# Patient Record
Sex: Male | Born: 1950 | Race: White | Hispanic: No | Smoking: Current every day smoker
Health system: Southern US, Community
[De-identification: ages and names within clinical notes are randomized; demographics above are authoritative.]

## PROBLEM LIST (undated history)

## (undated) DIAGNOSIS — G4730 Sleep apnea, unspecified: Secondary | ICD-10-CM

## (undated) DIAGNOSIS — J449 Chronic obstructive pulmonary disease, unspecified: Secondary | ICD-10-CM

---

## 2015-07-18 ENCOUNTER — Other Ambulatory Visit: Payer: Self-pay

## 2015-07-19 ENCOUNTER — Other Ambulatory Visit: Payer: Self-pay

## 2016-07-17 ENCOUNTER — Inpatient Hospital Stay (HOSPITAL_COMMUNITY): Payer: Medicare HMO

## 2016-07-17 ENCOUNTER — Emergency Department (HOSPITAL_COMMUNITY): Payer: Medicare HMO

## 2016-07-17 ENCOUNTER — Inpatient Hospital Stay (HOSPITAL_COMMUNITY)
Admission: EM | Admit: 2016-07-17 | Discharge: 2016-07-20 | DRG: 208 | Disposition: A | Discharge status: Home / Self Care | Payer: Medicare HMO | Attending: Internal Medicine | Admitting: Internal Medicine

## 2016-07-17 ENCOUNTER — Encounter (HOSPITAL_COMMUNITY): Payer: Self-pay | Admitting: Emergency Medicine

## 2016-07-17 DIAGNOSIS — J9622 Acute and chronic respiratory failure with hypercapnia: Secondary | ICD-10-CM | POA: Diagnosis present

## 2016-07-17 DIAGNOSIS — R739 Hyperglycemia, unspecified: Secondary | ICD-10-CM | POA: Diagnosis present

## 2016-07-17 DIAGNOSIS — T380X5A Adverse effect of glucocorticoids and synthetic analogues, initial encounter: Secondary | ICD-10-CM | POA: Diagnosis not present

## 2016-07-17 DIAGNOSIS — R791 Abnormal coagulation profile: Secondary | ICD-10-CM | POA: Diagnosis present

## 2016-07-17 DIAGNOSIS — R778 Other specified abnormalities of plasma proteins: Secondary | ICD-10-CM | POA: Diagnosis present

## 2016-07-17 DIAGNOSIS — I471 Supraventricular tachycardia: Secondary | ICD-10-CM | POA: Diagnosis present

## 2016-07-17 DIAGNOSIS — G9340 Encephalopathy, unspecified: Secondary | ICD-10-CM

## 2016-07-17 DIAGNOSIS — E872 Acidosis: Secondary | ICD-10-CM | POA: Diagnosis present

## 2016-07-17 DIAGNOSIS — J9601 Acute respiratory failure with hypoxia: Secondary | ICD-10-CM | POA: Diagnosis not present

## 2016-07-17 DIAGNOSIS — R030 Elevated blood-pressure reading, without diagnosis of hypertension: Secondary | ICD-10-CM | POA: Diagnosis present

## 2016-07-17 DIAGNOSIS — J441 Chronic obstructive pulmonary disease with (acute) exacerbation: Secondary | ICD-10-CM | POA: Diagnosis present

## 2016-07-17 DIAGNOSIS — D72829 Elevated white blood cell count, unspecified: Secondary | ICD-10-CM | POA: Diagnosis not present

## 2016-07-17 DIAGNOSIS — I480 Paroxysmal atrial fibrillation: Secondary | ICD-10-CM | POA: Diagnosis not present

## 2016-07-17 DIAGNOSIS — F17210 Nicotine dependence, cigarettes, uncomplicated: Secondary | ICD-10-CM | POA: Diagnosis present

## 2016-07-17 DIAGNOSIS — N179 Acute kidney failure, unspecified: Secondary | ICD-10-CM | POA: Diagnosis present

## 2016-07-17 DIAGNOSIS — G4730 Sleep apnea, unspecified: Secondary | ICD-10-CM | POA: Diagnosis present

## 2016-07-17 DIAGNOSIS — I959 Hypotension, unspecified: Secondary | ICD-10-CM | POA: Diagnosis present

## 2016-07-17 DIAGNOSIS — J9602 Acute respiratory failure with hypercapnia: Secondary | ICD-10-CM

## 2016-07-17 DIAGNOSIS — Z9289 Personal history of other medical treatment: Secondary | ICD-10-CM

## 2016-07-17 DIAGNOSIS — J9621 Acute and chronic respiratory failure with hypoxia: Secondary | ICD-10-CM | POA: Diagnosis present

## 2016-07-17 DIAGNOSIS — R0600 Dyspnea, unspecified: Secondary | ICD-10-CM | POA: Diagnosis not present

## 2016-07-17 DIAGNOSIS — J9600 Acute respiratory failure, unspecified whether with hypoxia or hypercapnia: Secondary | ICD-10-CM

## 2016-07-17 DIAGNOSIS — I2699 Other pulmonary embolism without acute cor pulmonale: Secondary | ICD-10-CM

## 2016-07-17 DIAGNOSIS — J449 Chronic obstructive pulmonary disease, unspecified: Secondary | ICD-10-CM | POA: Diagnosis not present

## 2016-07-17 HISTORY — DX: Chronic obstructive pulmonary disease, unspecified: J44.9

## 2016-07-17 HISTORY — DX: Sleep apnea, unspecified: G47.30

## 2016-07-17 LAB — RAPID URINE DRUG SCREEN, HOSP PERFORMED
Amphetamines: NOT DETECTED
Barbiturates: NOT DETECTED
Benzodiazepines: NOT DETECTED
Cocaine: NOT DETECTED
OPIATES: NOT DETECTED
Tetrahydrocannabinol: NOT DETECTED

## 2016-07-17 LAB — CBC WITH DIFFERENTIAL/PLATELET
BASOS PCT: 1 %
Basophils Absolute: 0.2 10*3/uL — ABNORMAL HIGH (ref 0.0–0.1)
EOS ABS: 0.8 10*3/uL — AB (ref 0.0–0.7)
EOS PCT: 5 %
HCT: 43.9 % (ref 39.0–52.0)
HEMOGLOBIN: 13.9 g/dL (ref 13.0–17.0)
LYMPHS PCT: 42 %
Lymphs Abs: 6.8 10*3/uL — ABNORMAL HIGH (ref 0.7–4.0)
MCH: 30.5 pg (ref 26.0–34.0)
MCHC: 31.7 g/dL (ref 30.0–36.0)
MCV: 96.5 fL (ref 78.0–100.0)
Monocytes Absolute: 1.3 10*3/uL — ABNORMAL HIGH (ref 0.1–1.0)
Monocytes Relative: 8 %
NEUTROS ABS: 7.1 10*3/uL (ref 1.7–7.7)
Neutrophils Relative %: 44 %
Platelets: 410 10*3/uL — ABNORMAL HIGH (ref 150–400)
RBC: 4.55 MIL/uL (ref 4.22–5.81)
RDW: 17.4 % — ABNORMAL HIGH (ref 11.5–15.5)
WBC: 16.2 10*3/uL — ABNORMAL HIGH (ref 4.0–10.5)

## 2016-07-17 LAB — POCT I-STAT 3, ART BLOOD GAS (G3+)
Acid-base deficit: 4 mmol/L — ABNORMAL HIGH (ref 0.0–2.0)
Bicarbonate: 23.9 mmol/L (ref 20.0–28.0)
O2 Saturation: 89 %
PCO2 ART: 52.4 mmHg — AB (ref 32.0–48.0)
PO2 ART: 65 mmHg — AB (ref 83.0–108.0)
TCO2: 25 mmol/L (ref 0–100)
pH, Arterial: 7.266 — ABNORMAL LOW (ref 7.350–7.450)

## 2016-07-17 LAB — COMPREHENSIVE METABOLIC PANEL
ALK PHOS: 52 U/L (ref 38–126)
ALT: 20 U/L (ref 17–63)
ANION GAP: 12 (ref 5–15)
AST: 26 U/L (ref 15–41)
Albumin: 3.8 g/dL (ref 3.5–5.0)
BILIRUBIN TOTAL: 0.4 mg/dL (ref 0.3–1.2)
BUN: 28 mg/dL — ABNORMAL HIGH (ref 6–20)
CALCIUM: 8.8 mg/dL — AB (ref 8.9–10.3)
CO2: 24 mmol/L (ref 22–32)
CREATININE: 1.42 mg/dL — AB (ref 0.61–1.24)
Chloride: 104 mmol/L (ref 101–111)
GFR, EST AFRICAN AMERICAN: 58 mL/min — AB (ref >60)
GFR, EST NON AFRICAN AMERICAN: 50 mL/min — AB (ref >60)
Glucose, Bld: 314 mg/dL — ABNORMAL HIGH (ref 65–99)
Potassium: 4.4 mmol/L (ref 3.5–5.1)
SODIUM: 140 mmol/L (ref 135–145)
TOTAL PROTEIN: 6.3 g/dL — AB (ref 6.5–8.1)

## 2016-07-17 LAB — BASIC METABOLIC PANEL
ANION GAP: 13 (ref 5–15)
BUN: 27 mg/dL — ABNORMAL HIGH (ref 6–20)
CHLORIDE: 103 mmol/L (ref 101–111)
CO2: 19 mmol/L — AB (ref 22–32)
Calcium: 7.3 mg/dL — ABNORMAL LOW (ref 8.9–10.3)
Creatinine, Ser: 1.41 mg/dL — ABNORMAL HIGH (ref 0.61–1.24)
GFR calc Af Amer: 58 mL/min — ABNORMAL LOW (ref >60)
GFR, EST NON AFRICAN AMERICAN: 50 mL/min — AB (ref >60)
GLUCOSE: 413 mg/dL — AB (ref 65–99)
POTASSIUM: 3.1 mmol/L — AB (ref 3.5–5.1)
Sodium: 135 mmol/L (ref 135–145)

## 2016-07-17 LAB — CBC
HCT: 35.4 % — ABNORMAL LOW (ref 39.0–52.0)
Hemoglobin: 11.1 g/dL — ABNORMAL LOW (ref 13.0–17.0)
MCH: 29.8 pg (ref 26.0–34.0)
MCHC: 30.8 g/dL (ref 30.0–36.0)
MCV: 96.7 fL (ref 78.0–100.0)
Platelets: 307 10*3/uL (ref 150–400)
RBC: 3.66 MIL/uL — ABNORMAL LOW (ref 4.22–5.81)
RDW: 17.4 % — AB (ref 11.5–15.5)
WBC: 10.7 10*3/uL — AB (ref 4.0–10.5)

## 2016-07-17 LAB — GLUCOSE, CAPILLARY
GLUCOSE-CAPILLARY: 249 mg/dL — AB (ref 65–99)
GLUCOSE-CAPILLARY: 149 mg/dL — AB (ref 65–99)
GLUCOSE-CAPILLARY: 174 mg/dL — AB (ref 65–99)
GLUCOSE-CAPILLARY: 170 mg/dL — AB (ref 65–99)
Glucose-Capillary: 241 mg/dL — ABNORMAL HIGH (ref 65–99)
Glucose-Capillary: 154 mg/dL — ABNORMAL HIGH (ref 65–99)

## 2016-07-17 LAB — I-STAT ARTERIAL BLOOD GAS, ED
ACID-BASE DEFICIT: 3 mmol/L — AB (ref 0.0–2.0)
Bicarbonate: 29 mmol/L — ABNORMAL HIGH (ref 20.0–28.0)
O2 SAT: 100 %
TCO2: 32 mmol/L (ref 0–100)
pCO2 arterial: 88.5 mmHg (ref 32.0–48.0)
pH, Arterial: 7.121 — CL (ref 7.350–7.450)
pO2, Arterial: 336 mmHg — ABNORMAL HIGH (ref 83.0–108.0)

## 2016-07-17 LAB — RESPIRATORY PANEL BY PCR
Adenovirus: NOT DETECTED
BORDETELLA PERTUSSIS-RVPCR: NOT DETECTED
CHLAMYDOPHILA PNEUMONIAE-RVPPCR: NOT DETECTED
CORONAVIRUS HKU1-RVPPCR: NOT DETECTED
Coronavirus 229E: NOT DETECTED
Coronavirus NL63: NOT DETECTED
Coronavirus OC43: NOT DETECTED
INFLUENZA A-RVPPCR: NOT DETECTED
INFLUENZA B-RVPPCR: NOT DETECTED
METAPNEUMOVIRUS-RVPPCR: NOT DETECTED
Mycoplasma pneumoniae: NOT DETECTED
PARAINFLUENZA VIRUS 2-RVPPCR: NOT DETECTED
PARAINFLUENZA VIRUS 3-RVPPCR: NOT DETECTED
PARAINFLUENZA VIRUS 4-RVPPCR: NOT DETECTED
Parainfluenza Virus 1: NOT DETECTED
RESPIRATORY SYNCYTIAL VIRUS-RVPPCR: NOT DETECTED
RHINOVIRUS / ENTEROVIRUS - RVPPCR: NOT DETECTED

## 2016-07-17 LAB — TROPONIN I
TROPONIN I: 0.26 ng/mL — AB (ref <0.03)
Troponin I: 0.43 ng/mL (ref <0.03)
Troponin I: 0.63 ng/mL (ref <0.03)

## 2016-07-17 LAB — PROCALCITONIN: PROCALCITONIN: 0.23 ng/mL

## 2016-07-17 LAB — MRSA PCR SCREENING: MRSA BY PCR: NEGATIVE

## 2016-07-17 LAB — BRAIN NATRIURETIC PEPTIDE: B NATRIURETIC PEPTIDE 5: 49.6 pg/mL (ref 0.0–100.0)

## 2016-07-17 LAB — HEPARIN LEVEL (UNFRACTIONATED): HEPARIN UNFRACTIONATED: 0.62 [IU]/mL (ref 0.30–0.70)

## 2016-07-17 LAB — I-STAT TROPONIN, ED: TROPONIN I, POC: 0 ng/mL (ref 0.00–0.08)

## 2016-07-17 LAB — TRIGLYCERIDES: TRIGLYCERIDES: 113 mg/dL (ref <150)

## 2016-07-17 LAB — LACTIC ACID, PLASMA
Lactic Acid, Venous: 2.2 mmol/L (ref 0.5–1.9)
Lactic Acid, Venous: 3.2 mmol/L (ref 0.5–1.9)

## 2016-07-17 LAB — D-DIMER, QUANTITATIVE (NOT AT ARMC): D DIMER QUANT: 1.78 ug{FEU}/mL — AB (ref 0.00–0.50)

## 2016-07-17 LAB — MAGNESIUM: Magnesium: 2.2 mg/dL (ref 1.7–2.4)

## 2016-07-17 LAB — PHOSPHORUS: Phosphorus: 3.3 mg/dL (ref 2.5–4.6)

## 2016-07-17 LAB — PATHOLOGIST SMEAR REVIEW

## 2016-07-17 MED ORDER — IOPAMIDOL (ISOVUE-370) INJECTION 76%
INTRAVENOUS | Status: AC
  Administered 2016-07-17: 100 mL
  Filled 2016-07-17: qty 100

## 2016-07-17 MED ORDER — POTASSIUM CHLORIDE 2 MEQ/ML IV SOLN
30.0000 meq | Freq: Once | INTRAVENOUS | Status: AC
  Administered 2016-07-17: 30 meq via INTRAVENOUS
  Filled 2016-07-17: qty 15

## 2016-07-17 MED ORDER — HEPARIN (PORCINE) IN NACL 100-0.45 UNIT/ML-% IJ SOLN
1400.0000 [IU]/h | INTRAMUSCULAR | Status: DC
  Administered 2016-07-17: 1400 [IU]/h via INTRAVENOUS
  Filled 2016-07-17 (×2): qty 250

## 2016-07-17 MED ORDER — DEXTROSE 5 % IV SOLN
500.0000 mg | INTRAVENOUS | Status: DC
  Administered 2016-07-17 – 2016-07-18 (×2): 500 mg via INTRAVENOUS
  Filled 2016-07-17 (×2): qty 500

## 2016-07-17 MED ORDER — FENTANYL 2500MCG IN NS 250ML (10MCG/ML) PREMIX INFUSION
25.0000 ug/h | INTRAVENOUS | Status: DC
Start: 2016-07-17 — End: 2016-07-17
  Administered 2016-07-17: 25 ug/h via INTRAVENOUS
  Filled 2016-07-17: qty 250

## 2016-07-17 MED ORDER — PROPOFOL 1000 MG/100ML IV EMUL
0.0000 ug/kg/min | INTRAVENOUS | Status: DC
  Administered 2016-07-17: 20 ug/kg/min via INTRAVENOUS

## 2016-07-17 MED ORDER — ALBUTEROL SULFATE (2.5 MG/3ML) 0.083% IN NEBU
10.0000 mg | INHALATION_SOLUTION | Freq: Once | RESPIRATORY_TRACT | Status: AC
  Administered 2016-07-17: 10 mg via RESPIRATORY_TRACT

## 2016-07-17 MED ORDER — SODIUM CHLORIDE 0.9 % IV BOLUS (SEPSIS)
500.0000 mL | Freq: Once | INTRAVENOUS | Status: AC
  Administered 2016-07-17: 500 mL via INTRAVENOUS

## 2016-07-17 MED ORDER — ALBUTEROL SULFATE (2.5 MG/3ML) 0.083% IN NEBU
2.5000 mg | INHALATION_SOLUTION | RESPIRATORY_TRACT | Status: DC | PRN
  Administered 2016-07-19: 2.5 mg via RESPIRATORY_TRACT
  Filled 2016-07-17: qty 3

## 2016-07-17 MED ORDER — ALBUTEROL SULFATE (2.5 MG/3ML) 0.083% IN NEBU: 2.5000 mg | INHALATION_SOLUTION | RESPIRATORY_TRACT | Status: DC | PRN

## 2016-07-17 MED ORDER — DEXMEDETOMIDINE HCL IN NACL 400 MCG/100ML IV SOLN
0.4000 ug/kg/h | INTRAVENOUS | Status: DC
  Administered 2016-07-17 (×2): 1 ug/kg/h via INTRAVENOUS
  Administered 2016-07-17: 1.2 ug/kg/h via INTRAVENOUS
  Administered 2016-07-17 – 2016-07-18 (×3): 1 ug/kg/h via INTRAVENOUS
  Filled 2016-07-17 (×7): qty 100

## 2016-07-17 MED ORDER — SODIUM CHLORIDE 0.9 % IV SOLN: 250.0000 mL | INTRAVENOUS | Status: DC | PRN

## 2016-07-17 MED ORDER — DEXTROSE 5 % IV SOLN
2.0000 g | Freq: Every day | INTRAVENOUS | Status: AC
  Administered 2016-07-17 – 2016-07-18 (×3): 2 g via INTRAVENOUS
  Filled 2016-07-17 (×3): qty 2

## 2016-07-17 MED ORDER — SODIUM CHLORIDE 0.9 % IV SOLN
0.0000 ug/min | INTRAVENOUS | Status: DC
  Administered 2016-07-17: 20 ug/min via INTRAVENOUS
  Filled 2016-07-17 (×2): qty 1

## 2016-07-17 MED ORDER — DEXMEDETOMIDINE HCL 200 MCG/2ML IV SOLN
0.4000 ug/kg/h | INTRAVENOUS | Status: DC
  Administered 2016-07-17: 0.4 ug/kg/h via INTRAVENOUS
  Administered 2016-07-17: 1 ug/kg/h via INTRAVENOUS
  Filled 2016-07-17: qty 2

## 2016-07-17 MED ORDER — ETOMIDATE 2 MG/ML IV SOLN
INTRAVENOUS | Status: AC | PRN
  Administered 2016-07-17: 20 mg via INTRAVENOUS

## 2016-07-17 MED ORDER — ORAL CARE MOUTH RINSE
15.0000 mL | Freq: Four times a day (QID) | OROMUCOSAL | Status: DC
  Administered 2016-07-17 – 2016-07-18 (×4): 15 mL via OROMUCOSAL

## 2016-07-17 MED ORDER — IPRATROPIUM-ALBUTEROL 0.5-2.5 (3) MG/3ML IN SOLN
3.0000 mL | RESPIRATORY_TRACT | Status: DC
  Administered 2016-07-17 – 2016-07-18 (×7): 3 mL via RESPIRATORY_TRACT
  Filled 2016-07-17 (×7): qty 3

## 2016-07-17 MED ORDER — FENTANYL CITRATE (PF) 100 MCG/2ML IJ SOLN: 50.0000 ug | INTRAMUSCULAR | Status: DC | PRN

## 2016-07-17 MED ORDER — FENTANYL CITRATE (PF) 100 MCG/2ML IJ SOLN
25.0000 ug | INTRAMUSCULAR | Status: DC | PRN
  Administered 2016-07-17: 25 ug via INTRAVENOUS
  Filled 2016-07-17: qty 2

## 2016-07-17 MED ORDER — HEPARIN BOLUS VIA INFUSION
5000.0000 [IU] | Freq: Once | INTRAVENOUS | Status: AC
  Administered 2016-07-17: 5000 [IU] via INTRAVENOUS
  Filled 2016-07-17: qty 5000

## 2016-07-17 MED ORDER — IPRATROPIUM-ALBUTEROL 0.5-2.5 (3) MG/3ML IN SOLN: 3.0000 mL | Freq: Four times a day (QID) | RESPIRATORY_TRACT | Status: DC

## 2016-07-17 MED ORDER — INSULIN ASPART 100 UNIT/ML ~~LOC~~ SOLN
0.0000 [IU] | SUBCUTANEOUS | Status: DC
  Administered 2016-07-17: 2 [IU] via SUBCUTANEOUS
  Administered 2016-07-17 (×2): 3 [IU] via SUBCUTANEOUS
  Administered 2016-07-17: 5 [IU] via SUBCUTANEOUS
  Administered 2016-07-17: 3 [IU] via SUBCUTANEOUS
  Administered 2016-07-17: 5 [IU] via SUBCUTANEOUS
  Administered 2016-07-18: 3 [IU] via SUBCUTANEOUS
  Administered 2016-07-18: 2 [IU] via SUBCUTANEOUS
  Administered 2016-07-18 (×2): 3 [IU] via SUBCUTANEOUS
  Administered 2016-07-19: 2 [IU] via SUBCUTANEOUS

## 2016-07-17 MED ORDER — SODIUM CHLORIDE 0.9 % IV BOLUS (SEPSIS)
1000.0000 mL | Freq: Once | INTRAVENOUS | Status: AC
  Administered 2016-07-17: 1000 mL via INTRAVENOUS

## 2016-07-17 MED ORDER — ALBUTEROL SULFATE (2.5 MG/3ML) 0.083% IN NEBU
INHALATION_SOLUTION | RESPIRATORY_TRACT | Status: AC
  Filled 2016-07-17: qty 12

## 2016-07-17 MED ORDER — FENTANYL CITRATE (PF) 100 MCG/2ML IJ SOLN
50.0000 ug | INTRAMUSCULAR | Status: DC | PRN
  Administered 2016-07-17: 50 ug via INTRAVENOUS
  Filled 2016-07-17: qty 2

## 2016-07-17 MED ORDER — METHYLPREDNISOLONE SODIUM SUCC 40 MG IJ SOLR
40.0000 mg | Freq: Two times a day (BID) | INTRAMUSCULAR | Status: DC
  Filled 2016-07-17 (×2): qty 1

## 2016-07-17 MED ORDER — METHYLPREDNISOLONE SODIUM SUCC 40 MG IJ SOLR
40.0000 mg | Freq: Four times a day (QID) | INTRAMUSCULAR | Status: DC
  Administered 2016-07-17 – 2016-07-19 (×8): 40 mg via INTRAVENOUS
  Filled 2016-07-17 (×10): qty 1

## 2016-07-17 MED ORDER — HEPARIN SODIUM (PORCINE) 5000 UNIT/ML IJ SOLN: 5000.0000 [IU] | Freq: Three times a day (TID) | INTRAMUSCULAR | Status: DC

## 2016-07-17 MED ORDER — PROPOFOL 1000 MG/100ML IV EMUL
5.0000 ug/kg/min | Freq: Once | INTRAVENOUS | Status: AC
  Administered 2016-07-17: 5 ug/kg/min via INTRAVENOUS
  Filled 2016-07-17: qty 100

## 2016-07-17 MED ORDER — SODIUM CHLORIDE 0.9 % IV SOLN
INTRAVENOUS | Status: DC
  Administered 2016-07-17: 04:00:00 via INTRAVENOUS

## 2016-07-17 MED ORDER — MIDAZOLAM HCL 2 MG/2ML IJ SOLN
1.0000 mg | INTRAMUSCULAR | Status: DC | PRN
  Administered 2016-07-17 (×4): 2 mg via INTRAVENOUS
  Administered 2016-07-17: 1 mg via INTRAVENOUS
  Administered 2016-07-17: 2 mg via INTRAVENOUS
  Filled 2016-07-17 (×6): qty 2
  Filled 2016-07-17: qty 4

## 2016-07-17 MED ORDER — PANTOPRAZOLE SODIUM 40 MG IV SOLR
40.0000 mg | Freq: Every day | INTRAVENOUS | Status: DC
  Administered 2016-07-17 – 2016-07-18 (×3): 40 mg via INTRAVENOUS
  Filled 2016-07-17 (×3): qty 40

## 2016-07-17 MED ORDER — CHLORHEXIDINE GLUCONATE 0.12% ORAL RINSE (MEDLINE KIT)
15.0000 mL | Freq: Two times a day (BID) | OROMUCOSAL | Status: DC
  Administered 2016-07-17 – 2016-07-18 (×3): 15 mL via OROMUCOSAL

## 2016-07-17 MED ORDER — HEPARIN SODIUM (PORCINE) 5000 UNIT/ML IJ SOLN
5000.0000 [IU] | Freq: Three times a day (TID) | INTRAMUSCULAR | Status: DC
  Administered 2016-07-17 – 2016-07-20 (×8): 5000 [IU] via SUBCUTANEOUS
  Filled 2016-07-17 (×10): qty 1

## 2016-07-17 MED ORDER — ALBUTEROL (5 MG/ML) CONTINUOUS INHALATION SOLN: 15.0000 mg/h | INHALATION_SOLUTION | Freq: Once | RESPIRATORY_TRACT | Status: DC

## 2016-07-17 MED ORDER — SUCCINYLCHOLINE CHLORIDE 20 MG/ML IJ SOLN
INTRAMUSCULAR | Status: AC | PRN
  Administered 2016-07-17: 150 mg via INTRAVENOUS

## 2016-07-17 NOTE — Progress Notes (Signed)
Patient transferred from ER to 2MW6 no events. 100% O2

## 2016-07-17 NOTE — Progress Notes (Signed)
Pt transported to and from 2M06 to CT 2 on ventilator with no complications. VS within normal limits. RT will continue to monitor.

## 2016-07-17 NOTE — Progress Notes (Signed)
Dr. Allena Earing for Low bp, panic Troponin .26 and low KCL 3.1. Orders received. Patient remains stable, wakeful and request ET tube be removed.

## 2016-07-17 NOTE — Progress Notes (Signed)
SCD hose removed due to patient agitation. Patient also on heparin for DVT prophylaxis.

## 2016-07-17 NOTE — Progress Notes (Signed)
Pt transported to/from CT back to ER Trauma B bay. 100% O2 no complications noted .

## 2016-07-17 NOTE — Progress Notes (Addendum)
ANTICOAGULATION CONSULT NOTE - Follow Up Consult  Pharmacy Consult for Heparin Indication: chest pain/ACS  No Known Allergies  Patient Measurements: Height:  (177.8 cm) Weight: 182 lb 15.7 oz (83 kg) IBW/kg (Calculated) : 73 Heparin Dosing Weight: 88.5  Vital Signs: Temp: 98.9 F (37.2 C) (04/04 1200) Temp Source: Oral (04/04 1200) BP: 107/63 (04/04 1130) Pulse Rate: 94 (04/04 1130)  Labs:  Recent Labs  07/17/16 0043 07/17/16 0546 07/17/16 1123  HGB 13.9 11.1*  --   HCT 43.9 35.4*  --   PLT 410* 307  --   HEPARINUNFRC  --   --  0.62  CREATININE 1.42* 1.41*  --   TROPONINI  --  0.26*  --     Estimated Creatinine Clearance: 52.5 mL/min (A) (by C-G formula based on SCr of 1.41 mg/dL (H)).  Assessment: 66 yo male admitted with COPD exacerbation and respiratory failure, initially with suspicion for PE. No AC PTA. D-dimer elevated. Dopplers and CTA negative for VTE; however, topronins now elevated x2.   Hgb down to 11.1, PLTC down, wnl. No bleeding noted.   Pt originally rec'd bolus of 5000 units and started at 14 units/hr. Heparin level therapeutic at 0.62 on 14 units/hr.   Goal of Therapy:  Heparin level 0.3-0.7 units/ml Monitor platelets by anticoagulation protocol: Yes   Plan:  Continue Heparin 14 units/hr Confirmatory heparin level  Daily HL, CBC Monitor for s/sx bleeding F/u CTA, dopplers   Allena Katz, Pharm.D. PGY1 Pharmacy Resident 4/4/201812:32 PM Pager [phone]

## 2016-07-17 NOTE — Progress Notes (Signed)
210 mls fentanyl gtt wasted in sink. Witnessed by Eddie North, RN.

## 2016-07-17 NOTE — Progress Notes (Addendum)
Family at bedside, updated as to patient progress and plan of care. Wife Burna Mortimer. Family lives out of town in [place].

## 2016-07-17 NOTE — Progress Notes (Signed)
eLink Physician-Brief Progress Note Patient Name: Darrell Shah DOB: 03-28-1951 MRN: [id number]   Date of Service  07/17/2016  HPI/Events of Note  Elevated d-dimer.  eICU Interventions  Will check LE dopplers. Start heparin empirically. Hold off on CTA chest due to renal insuff.     Intervention Category Intermediate Interventions: Diagnostic test evaluation  Dorothyann Gibbs 07/17/2016, 4:52 AM

## 2016-07-17 NOTE — Progress Notes (Signed)
Patient reaching for ETT trying to remove. Precidex Drip increased to max dose at this time for agitation.

## 2016-07-17 NOTE — Progress Notes (Signed)
eLink Physician-Brief Progress Note Patient Name: [name] DOB: 1951-01-13 MRN: [id number]   Date of Service  07/17/2016  HPI/Events of Note  Notified by nursing of BP 67/40  eICU Interventions  Will give fluid bolus. Check LA and repeat ABG.     Intervention Category Major Interventions: Hypotension - evaluation and management  Dorothyann Gibbs 07/17/2016, 3:15 AM

## 2016-07-17 NOTE — Progress Notes (Signed)
**  Preliminary report by tech**  Bilateral lower extremity venous duplex completed. There is no evidence of deep or superficial vein thrombosis involving the right and left lower extremities. All visualized vessels appear patent and compressible. There is no evidence of Baker's cysts bilaterally. Results were given to the patient's nurse, Josh.  07/17/16 1:30 PM Olen Cordial RVT

## 2016-07-17 NOTE — Progress Notes (Signed)
Pharmacy consulted to start subcutaneous heparin for VTE prophylaxis as no evidence of PE/DVT. Verified heparin drip was discontinued by RN. No bleeding noted. Will start prophylactic heparin.  Plan: Discontinue IV heparin drip Start heparin 5000 units subcutaneously q 8 hours Pharmacy will sign off.  Thank you for allowing [place] to participate in this patients care. Signe Colt, PharmD Pager: [phone]

## 2016-07-17 NOTE — Procedures (Signed)
Central Venous Catheter Insertion Procedure Note Darrell Shah [id number] 21-Apr-1950  Procedure: Insertion of Central Venous Catheter Indications: Dialysis need  Procedure Details Consent: Risks of procedure as well as the alternatives and risks of each were explained to the (patient/caregiver).  Consent for procedure obtained. Time Out: Verified patient identification, verified procedure, site/side was marked, verified correct patient position, special equipment/implants available, medications/allergies/relevent history reviewed, required imaging and test results available.  Performed  Maximum sterile technique was used including antiseptics, cap, gloves, gown, hand hygiene, mask and sheet. Skin prep: Chlorhexidine; local anesthetic administered A antimicrobial bonded/coated triple lumen catheter was placed in the right internal jugular vein using the Seldinger technique.  Evaluation Blood flow good Complications: No apparent complications Patient did tolerate procedure well. Chest X-ray ordered to verify placement.  CXR: normal.  U/S used in placement.  Darrell Shah 07/17/2016, 12:43 PM

## 2016-07-17 NOTE — Progress Notes (Signed)
ANTICOAGULATION CONSULT NOTE - Initial Consult  Pharmacy Consult for Heparin Indication: suspected VTE  No Known Allergies  Patient Measurements: Height:  (177.8 cm) Weight: 182 lb 15.7 oz (83 kg) IBW/kg (Calculated) : 73  Vital Signs: Temp: 97.2 F (36.2 C) (04/04 0356) Temp Source: Oral (04/04 0356) BP: 82/53 (04/04 0415) Pulse Rate: 77 (04/04 0415)  Labs:  Recent Labs  07/17/16 0043  HGB 13.9  HCT 43.9  PLT 410*  CREATININE 1.42*    Estimated Creatinine Clearance: 52.1 mL/min (A) (by C-G formula based on SCr of 1.42 mg/dL (H)).   Medical History: Past Medical History:  Diagnosis Date  . COPD (chronic obstructive pulmonary disease) (HCC)     Medications:  Awaiting home med rec  Assessment: 66 y.o. M presents with SOB requiring intubation in the ED. To begin heparin gtt for suspected VTE. CBC ok on admission.  Goal of Therapy:  Heparin level 0.3-0.7 units/ml Monitor platelets by anticoagulation protocol: Yes   Plan:  D/c SQ heparin (none given yet) Heparin IV bolus 5000 units Heparin gtt at 1400 units/hr Will f/u heparin level in 6 hours Daily heparin level and CBC  Christoper Fabian, PharmD, BCPS Clinical pharmacist, pager [phone] 07/17/2016,4:31 AM

## 2016-07-17 NOTE — ED Provider Notes (Signed)
Pt intubated due to respiratory failure likely secondary to COPD exacerbation  INTUBATION Performed by: Thermon Leyland  Required items: required blood products, implants, devices, and special equipment available Patient identity confirmed: provided demographic data and hospital-assigned identification number Time out: Immediately prior to procedure a "time out" was called to verify the correct patient, procedure, equipment, support staff and site/side marked as required.  Indications: Respiratory failure  Intubation method: Glidescope Laryngoscopy   Preoxygenation: BiPAP, Bag mask  Sedatives: Etomidate Paralytic: Succinylcholine  Tube Size: 8 cuffed  Post-procedure assessment: chest rise and ETCO2 monitor Breath sounds: equal and absent over the epigastrium Tube secured with: ETT holder Chest x-ray interpreted by radiologist and me.  Chest x-ray findings: endotracheal tube in appropriate position  Patient tolerated the procedure well with no immediate complications.     Eyvonne Mechanic, PA-C 07/17/16 9528    Shon Baton, MD 07/17/16 [phone]

## 2016-07-17 NOTE — Progress Notes (Signed)
Pt was tried of NIV on arrival to the ED. Didn't tolerated it well and Failed NIV due to increase WOB in the upper 50's, poor CO2 clearance, and marked hypercarbic acidosis. Pt resemble accessory muscle usage and appears very uncomfortable. Patient was subsequently Intubated.   Pt has a history of COPD and currently still a cigarette smoker.

## 2016-07-17 NOTE — Progress Notes (Addendum)
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 Quickly decomp with AMS and resp acidosis and was subsequently intubated. Renal function limits CTA (Cr 1.4), started on empiric heparin for PE  LINES / TUBES: L hand PIV R hand PIV L forearm PIV ETT 4/4>>>  CULTURES: Resp panel>>> Resp culture>>> MRSA screen NEG>>>  ANTIBIOTICS: Ceftriaxone 4/4>>> Azithromycin 4/4>>>  OTHER: Phenylephrine ggt >> Heparin ggt >> Solumedrol 40 Q6H >> Precedex >> Duonebs Q4H >>  SIGNIFICANT EVENTS:  3 am: hypotensive to 68/41, s/p 2 L bolus with minimal improvement 730 am: started on IV neo  INTERVAL HISTORY: No events overnight, arousable.  VITAL SIGNS: Temp:  [95.7 F (35.4 C)-97.7 F (36.5 C)] 97.2 F (36.2 C) (04/04 0356) Pulse Rate:  [46-131] 83 (04/04 0715) Resp:  [14-30] 24 (04/04 0715) BP: (64-141)/(36-92) 79/56 (04/04 0715) SpO2:  [91 %-100 %] 100 % (04/04 0715) FiO2 (%):  [40 %] 40 % (04/04 0400) Weight:  [182 lb 15.7 oz (83 kg)-195 lb (88.5 kg)] 182 lb 15.7 oz (83 kg) (04/04 0320)  HEMODYNAMICS:    VENTILATOR SETTINGS: Vent Mode: PRVC FiO2 (%):  [40 %] 40 % Set Rate:  [24 bmp] 24 bmp Vt Set:  [580 mL] 580 mL PEEP:  [5 cmH20] 5 cmH20 Plateau Pressure:  [25 cmH20-26 cmH20] 26 cmH20  INTAKE / OUTPUT: Intake/Output      04/03 0701 - 04/04 0700 04/04 0701 - 04/05 0700   I.V. (mL/kg) 1159.8 (14)    IV Piggyback 2300    Total Intake(mL/kg) 3459.8 (41.7)    Urine (mL/kg/hr) 350    Total Output 350     Net +3109.8           PHYSICAL EXAMINATION: General:  Critically-ill appearing caucasian male. +ETT, +NG Neuro:  RASS -1, -2 Cardiovascular:  R, IR, no murmur appr Lungs:  Coarse and dim bs  BL Abdomen: Protuberant but soft. +bs Musculoskeletal:  Trace edema BL LE. R>L Skin:  Without cyanosis  LABS: Cbc  Recent Labs Lab 07/17/16 0043 07/17/16 0546  WBC 16.2* 10.7*  HGB 13.9 11.1*  HCT 43.9 35.4*  PLT 410* 307   Chemistry  Recent Labs Lab 07/17/16 0043 07/17/16 0546  NA 140 135  K 4.4 3.1*  CL 104 103  CO2 24 19*  BUN 28* 27*  CREATININE 1.42* 1.41*  CALCIUM 8.8* 7.3*  MG  --  2.2  PHOS  --  3.3  GLUCOSE 314* 413*   Liver fxn  Recent Labs Lab 07/17/16 0043  AST 26  ALT 20  ALKPHOS 52  BILITOT 0.4  PROT 6.3*  ALBUMIN 3.8   coags No results for input(s): APTT, INR in the last 168 hours. Sepsis markers  Recent Labs Lab 07/17/16 0317 07/17/16 0546  LATICACIDVEN 2.2* 3.2*  PROCALCITON  --  0.23   Cardiac markers  Recent Labs Lab 07/17/16 0546  TROPONINI 0.26*   BNP No results for input(s): PROBNP in the last 168 hours. ABG  Recent Labs Lab 07/17/16 0051 07/17/16 0325  PHART 7.121* 7.266*  PCO2ART 88.5* 52.4*  PO2ART 336.0* 65.0*  HCO3 29.0* 23.9  TCO2 32 25   CBG trend  Recent Labs Lab 07/17/16 0257 07/17/16 0355  GLUCAP 249* 241*   IMAGING:  ECG:  DIAGNOSES: Active Problems:   Acute hypoxemic respiratory failure (HCC)  ASSESSMENT / PLAN:  PULMONARY ASSESSMENT: Acute hypoxemic respiratory failure with hypercarbia 2/2 PE ?COPD exacerbation  Initial ABG pH 7.1, CO2 88 --> pH 7.26, pCO2 54, pO2 65 D-dimer 1.78, Cr 1.4. V/Q scan not applicable.  Cr 1.4,  Pct 0.23  PLAN:   ECHO pending BL LE dopplers doppler Doubt COPD exacerbation, cont Abx and steroids for now.  Resp panel pend CTA PE  CARDIOVASCULAR ASSESSMENT: Hypotensive, propofol vs PE. Req pressors Trop 0.26, likely heart strain from PE  PLAN:  Phenylephrine Trend trop, on tele W/u as above If TLC then will need to place TLC  RENAL ASSESSMENT:  Cr 1.4, unknown BL  PLAN:   Has been receiving IF 1L NS  GASTROINTESTINAL ASSESSMENT:   NPO, NG  PLAN:   Cont  HEMATOLOGIC ASSESSMENT:   PLAN:  CBC in AM Transfuse per ICU protocol  INFECTIOUS ASSESSMENT:  ?Copd exac PLAN:   Ceftriaxone + Azith start 4/4  ENDOCRINE ASSESSMENT:  Hyperglycemic, likely DM2   PLAN:   ISS Hba1c  NEUROLOGIC ASSESSMENT:  AMS 2/2 hypercarbia.  UDS neg CT head neg  PLAN:   Precedex PRN fentanyl  CLINICAL SUMMARY: Will perform CTA today.  Continue heparin.  Treat COPD exacerbation.  The patient is critically ill with multiple organ systems failure and requires high complexity decision making for assessment and support, frequent evaluation and titration of therapies, application of advanced monitoring technologies and extensive interpretation of multiple databases.   Critical Care Time devoted to patient care services described in this note is  35  Minutes. This time reflects time of care of this signee Dr Koren Bound. This critical care time does not reflect procedure time, or teaching time or supervisory time of PA/NP/Med student/Med Resident etc but could involve care discussion time.  Alyson Reedy, M.D. [facility] Pulmonary/Critical Care Medicine. Pager: [phone]. After hours pager: [phone].  07/17/2016, 7:31 AM

## 2016-07-17 NOTE — ED Triage Notes (Signed)
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 Pt responsive to voice, given  solumedrol, 15 mg albuterol, 0.3 epi IM PTA, NPA in place, BP-160/100, capnography- 46

## 2016-07-17 NOTE — H&P (Signed)
PULMONARY / CRITICAL CARE MEDICINE   Name: Darrell Shah MRN: [id number] DOB: 12-07-1950    ADMISSION DATE:  07/17/2016 CONSULTATION DATE:  07/17/16  REFERRING MD:  [facility] - ED[facility]  CHIEF COMPLAINT:  SOB  HISTORY OF PRESENT ILLNESS:  Pt is encephelopathic; therefore, this HPI is obtained from chart review. Darrell Shah is a 66 y.o. male with PMH of COPD per report and who works as a Naval architect in [employer].  He pulled over on the side of the road on evening of 07/16/16 for SOB.  After EMS arrived, his SOB became progressively worse.  He was brought to ED where ABG revealed hypercarbic respiratory failure.  He was tried on BiPAP but did not improve and was subsequently intubated.  PCCM was subsequently called for admission.   PAST MEDICAL HISTORY :  He  has a past medical history of COPD (chronic obstructive pulmonary disease) (HCC).  PAST SURGICAL HISTORY: He  has no past surgical history on file.  No Known Allergies  No current facility-administered medications on file prior to encounter.    No current outpatient prescriptions on file prior to encounter.    FAMILY HISTORY:  His has no family status information on file.    SOCIAL HISTORY: He    REVIEW OF SYSTEMS:   Unable to obtain as pt is encephalopathic.  SUBJECTIVE:  On vent, agitated.  VITAL SIGNS: BP 97/80   Pulse (!) 102   Temp 97.7 F (36.5 C) (Axillary)   Resp 17   Ht  (1.778 m)   Wt 88.5 kg (195 lb)   SpO2 99%   BMI 27.98 kg/m   HEMODYNAMICS:    VENTILATOR SETTINGS: Vent Mode: PRVC FiO2 (%):  [40 %] 40 % Set Rate:  [24 bmp] 24 bmp Vt Set:  [580 mL] 580 mL PEEP:  [5 cmH20] 5 cmH20 Plateau Pressure:  [25 cmH20] 25 cmH20  INTAKE / OUTPUT: No intake/output data recorded.   PHYSICAL EXAMINATION: General: Adult male, agitated on vent. Neuro: Agitated on vent. HEENT: [place]/AT. PERRL, sclerae anicteric. Cardiovascular: RRR, no M/R/G.  Lungs: Respirations even and unlabored.  Expiratory wheezes  bilaterally. Abdomen: BS x 4, soft, NT/ND.  Musculoskeletal: No gross deformities, no edema.  Skin: Intact, warm, no rashes.  LABS:  BMET  Recent Labs Lab 07/17/16 0043  NA 140  K 4.4  CL 104  CO2 24  BUN 28*  CREATININE 1.42*  GLUCOSE 314*    Electrolytes  Recent Labs Lab 07/17/16 0043  CALCIUM 8.8*    CBC  Recent Labs Lab 07/17/16 0043  WBC 16.2*  HGB 13.9  HCT 43.9  PLT 410*    Coag's No results for input(s): APTT, INR in the last 168 hours.  Sepsis Markers No results for input(s): LATICACIDVEN, PROCALCITON, O2SATVEN in the last 168 hours.  ABG  Recent Labs Lab 07/17/16 0051  PHART 7.121*  PCO2ART 88.5*  PO2ART 336.0*    Liver Enzymes  Recent Labs Lab 07/17/16 0043  AST 26  ALT 20  ALKPHOS 52  BILITOT 0.4  ALBUMIN 3.8    Cardiac Enzymes No results for input(s): TROPONINI, PROBNP in the last 168 hours.  Glucose No results for input(s): GLUCAP in the last 168 hours.  Imaging No results found.   STUDIES:  CT head 4/4 >   CULTURES: Sputum 4/4 >   ANTIBIOTICS: Ceftriaxone 4/4 >   SIGNIFICANT EVENTS: 4/4 > admit.  LINES/TUBES: ETT 4/4 >   DISCUSSION: 66 y.o. male with hx of COPD  per report, admitted 4/4 with hypercarbic respiratory failure.  Failed BiPAP and was subsequently intubated.  ASSESSMENT / PLAN:  PULMONARY A: Acute hypercarbic respiratory failure. COPD per report - with probable exacerbation. ? 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 P:   Full vent support. Wean as able. VAP prevention measures. SBT in AM if able. Empiric ceftiaxone. DuoNebs / Albuterol. Assess D-Dimer, if elevated then start empiric heparin and assess LE duplex (CTA restricted due to AKI). CXR in AM.  NEUROLOGIC A:   Acute encephalopathy due to sedation + hypercarbia. P:   Sedation:  Propofol gtt / Fentanyl  gtt. RASS goal: 0 to -1. Daily WUA. Assess UDS.  CARDIOVASCULAR A:  Hypotension - presumed due to RSI. P:  Monitor.  RENAL A:   AKI - unclear baseline. P:   NS @ 75. BMP in AM.  GASTROINTESTINAL A:   GI prophylaxis. Nutrition. P:   SUP: Pantoprazole. NPO.  HEMATOLOGIC A:   VTE Prophylaxis. P:  SCD's / heparin. CBC in AM.  INFECTIOUS A:   Possible AECOPD. P:   Abx as above (ceftriaxone).  Follow cultures as above.  ENDOCRINE A:   Hyperglycemia - no known hx DM.   P:   SSI. Assess Hgb A1c.   Family updated: None available.  Interdisciplinary Family Meeting v Palliative Care Meeting:  Due by: 07/23/16.  CC time: 30 min.   Rutherford Guys, [place] - [facility] [place] Pulmonary & Critical Care Medicine Pager: [phone]  or [phone] 07/17/2016, 1:31 AM

## 2016-07-17 NOTE — ED Provider Notes (Signed)
[facility]EMERGENCY DEPT Provider Note   CSN: [id number] Arrival date & time: 07/17/16  0040  By signing my name below, I, Arianna Nassar, attest that this documentation has been prepared under the direction and in the presence of Shon Baton, MD.  Electronically Signed: Octavia Heir, ED Scribe. 07/17/16. 12:50 AM.    History   Chief Complaint Chief Complaint  Patient presents with  . Respiratory Distress   LEVEL V CAVEAT: HPI and ROS limited due to pt is in respiratory distress.  The history is provided by the EMS personnel. The history is limited by the condition of the patient. No language interpreter was used.   HPI Comments: Darrell Shah is a 66 y.o. male brought in by ambulance, who has a Pmhx of COPD presents to the Emergency Department in respiratory distress. 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 Per EMS, they were called out for shortness of breath. Pt was able to call himself. They report that his shortness of breath became progressively worse. He received 125 mg of solumedrol, 15 mg of albuterol and .3 of epi via EMS. Pt is responsive to voice. Decreased mental status en route. Pt has a PMhx COPD and is still smoking cigarettes. He nodded his head no to chest pain.   Past Medical History:  Diagnosis Date  . COPD (chronic obstructive pulmonary disease) (HCC)     There are no active problems to display for this patient.   No past surgical history on file.     Home Medications    Prior to Admission medications   Not on File    Family History No family history on file.  Social History Social History  Substance Use Topics  . Smoking status: Not on file  . Smokeless tobacco: Not on file  . Alcohol use Not on file     Allergies   Patient has no known allergies.   Review of Systems Review of Systems  Unable to perform ROS: Acuity of condition  Respiratory: Positive for  shortness of breath.   Cardiovascular: Negative for chest pain.  All other systems reviewed and are negative.   LEVEL V CAVEAT: HPI and ROS limited due to pt is in respiratory distress.   Physical Exam Updated Vital Signs BP (!) 141/86   Pulse (!) 117   Temp 97.7 F (36.5 C) (Axillary)   Resp (!) 21   Ht  (1.778 m)   Wt 195 lb (88.5 kg)   SpO2 100%   BMI 27.98 kg/m   Physical Exam  Constitutional: He is oriented to person, place, and time.  Ill-appearing, tachypnea, increased work of breathing  HENT:  Head: Normocephalic and atraumatic.  Mucous membranes dry  Cardiovascular: Regular rhythm and normal heart sounds.   No murmur heard. Tachycardia  Pulmonary/Chest: He is in respiratory distress. He has wheezes.  Increased work of breathing, tightness, diffuse expiratory wheezing, accessory muscle use  Abdominal: Soft. Bowel sounds are normal.  Musculoskeletal: He exhibits no edema.  Trace lower extremity edema  Neurological: He is alert and oriented to person, place, and time.  Skin: Skin is warm and dry.  Psychiatric: He has a normal mood and affect.  Nursing note and vitals reviewed.    ED Treatments / Results  DIAGNOSTIC STUDIES: Oxygen Saturation is 100% on bipap, normal by my interpretation.  COORDINATION OF CARE:  12:49 AM Discussed treatment plan with staff at  bedside and staff agreed to plan.  Labs (all labs ordered are listed, but only abnormal results are displayed) Labs Reviewed  CBC WITH DIFFERENTIAL/PLATELET - Abnormal; Notable for the following:       Result Value   WBC 16.2 (*)    RDW 17.4 (*)    Platelets 410 (*)    All other components within normal limits  I-STAT ARTERIAL BLOOD GAS, ED - Abnormal; Notable for the following:    pH, Arterial 7.121 (*)    pCO2 arterial 88.5 (*)    pO2, Arterial 336.0 (*)    Bicarbonate 29.0 (*)    Acid-base deficit 3.0 (*)    All other components within normal limits  COMPREHENSIVE METABOLIC PANEL    BRAIN NATRIURETIC PEPTIDE  RAPID URINE DRUG SCREEN, HOSP PERFORMED  I-STAT TROPOININ, ED  I-STAT CG4 LACTIC ACID, ED    EKG  EKG Interpretation  Date/Time:  Wednesday July 17 2016 00:49:12 EDT Ventricular Rate:  122 PR Interval:    QRS Duration: 98 QT Interval:  312 QTC Calculation: 445 R Axis:   -94 Text Interpretation:  Sinus tachycardia Left anterior fascicular block Abnormal R-wave progression, early transition Nonspecific T abnrm, anterolateral leads ST elevation, consider inferior injury Artifact in lead(s) I II III aVR aVL aVF V1 V2 V3 V4 V5 V6 Wandering baseline Confirmed by Jazzmyne Rasnick  MD, Toni Amend ([id number]) on 07/17/2016 1:10:12 AM       Radiology No results found.  Procedures Procedures (including critical care time) CRITICAL CARE Performed by: Shon Baton, MD    Total critical care time: 40 minutes  Critical care time was exclusive of separately billable procedures and treating other patients.  Critical care was necessary to treat or prevent imminent or life-threatening deterioration.  Critical care was time spent personally by me on the following activities: development of treatment plan with patient and/or surrogate as well as nursing, discussions with consultants, evaluation of patient's response to treatment, examination of patient, obtaining history from patient or surrogate, ordering and performing treatments and interventions, ordering and review of laboratory studies, ordering and review of radiographic studies, pulse oximetry and re-evaluation of patient's condition.   Medications Ordered in ED Medications  albuterol (PROVENTIL,VENTOLIN) solution continuous neb (not administered)  sodium chloride 0.9 % bolus 1,000 mL (not administered)  propofol (DIPRIVAN) 1000 MG/100ML infusion (not administered)  etomidate (AMIDATE) injection (20 mg Intravenous Given 07/17/16 0102)  succinylcholine (ANECTINE) injection (150 mg Intravenous Given 07/17/16 0102)      Initial Impression / Assessment and Plan / ED Course  I have reviewed the triage vital signs and the nursing notes.  Pertinent labs & imaging results that were available during my care of the patient were reviewed by me and considered in my medical decision making (see chart for details).     Patient presents in respiratory distress. He is minimally responsive. Markedly increased work of breathing and wheezing in all lung fields. BiPAP was initially trialed. ABG shows a pH of 7.11 and a CO2 of 90. No improvement on BiPAP and patient's mental status continues to deteriorate. He is now areas. Decision made to intubate. Suspect this is all related to acute COPD exacerbation. He is afebrile. Chest x-ray is pending. Patient was intubated by Burna Forts, PA. I was present for the entire procedure. Discussed with critical care. They have requested additional workup including UDS and CT head. These have been ordered.  Final Clinical Impressions(s) / ED Diagnoses   Final diagnoses:  COPD exacerbation (HCC)  Acute  respiratory failure with hypercapnia (HCC)   I personally performed the services described in this documentation, which was scribed in my presence. The recorded information has been reviewed and is accurate.   New Prescriptions New Prescriptions   No medications on file     Shon Baton, MD 07/17/16 [phone]

## 2016-07-18 ENCOUNTER — Encounter (HOSPITAL_COMMUNITY): Payer: Self-pay | Admitting: *Deleted

## 2016-07-18 ENCOUNTER — Inpatient Hospital Stay (HOSPITAL_COMMUNITY): Payer: Medicare HMO

## 2016-07-18 DIAGNOSIS — I471 Supraventricular tachycardia: Secondary | ICD-10-CM

## 2016-07-18 DIAGNOSIS — J449 Chronic obstructive pulmonary disease, unspecified: Secondary | ICD-10-CM

## 2016-07-18 DIAGNOSIS — I480 Paroxysmal atrial fibrillation: Secondary | ICD-10-CM | POA: Diagnosis not present

## 2016-07-18 LAB — BLOOD GAS, ARTERIAL
Acid-base deficit: 2.5 mmol/L — ABNORMAL HIGH (ref 0.0–2.0)
BICARBONATE: 21.6 mmol/L (ref 20.0–28.0)
Drawn by: 41977
FIO2: 40
LHR: 24 {breaths}/min
O2 SAT: 95.1 %
PATIENT TEMPERATURE: 98.6
PCO2 ART: 35.8 mmHg (ref 32.0–48.0)
PEEP: 5 cmH2O
PH ART: 7.397 (ref 7.350–7.450)
PO2 ART: 78.6 mmHg — AB (ref 83.0–108.0)
VT: 540 mL

## 2016-07-18 LAB — GLUCOSE, CAPILLARY
GLUCOSE-CAPILLARY: 182 mg/dL — AB (ref 65–99)
GLUCOSE-CAPILLARY: 124 mg/dL — AB (ref 65–99)
GLUCOSE-CAPILLARY: 107 mg/dL — AB (ref 65–99)
GLUCOSE-CAPILLARY: 124 mg/dL — AB (ref 65–99)
Glucose-Capillary: 115 mg/dL — ABNORMAL HIGH (ref 65–99)
Glucose-Capillary: 175 mg/dL — ABNORMAL HIGH (ref 65–99)
Glucose-Capillary: 177 mg/dL — ABNORMAL HIGH (ref 65–99)

## 2016-07-18 LAB — HEMOGLOBIN A1C
Hgb A1c MFr Bld: 5.9 % — ABNORMAL HIGH (ref 4.8–5.6)
MEAN PLASMA GLUCOSE: 123 mg/dL

## 2016-07-18 LAB — BASIC METABOLIC PANEL
ANION GAP: 9 (ref 5–15)
BUN: 26 mg/dL — AB (ref 6–20)
CO2: 21 mmol/L — ABNORMAL LOW (ref 22–32)
Calcium: 8.4 mg/dL — ABNORMAL LOW (ref 8.9–10.3)
Chloride: 110 mmol/L (ref 101–111)
Creatinine, Ser: 1 mg/dL (ref 0.61–1.24)
GFR calc Af Amer: >60 mL/min (ref >60)
GLUCOSE: 211 mg/dL — AB (ref 65–99)
POTASSIUM: 3.7 mmol/L (ref 3.5–5.1)
Sodium: 140 mmol/L (ref 135–145)

## 2016-07-18 LAB — CBC
HCT: 36.4 % — ABNORMAL LOW (ref 39.0–52.0)
HEMOGLOBIN: 11.6 g/dL — AB (ref 13.0–17.0)
MCH: 30 pg (ref 26.0–34.0)
MCHC: 31.9 g/dL (ref 30.0–36.0)
MCV: 94.1 fL (ref 78.0–100.0)
PLATELETS: 320 10*3/uL (ref 150–400)
RBC: 3.87 MIL/uL — AB (ref 4.22–5.81)
RDW: 18.1 % — ABNORMAL HIGH (ref 11.5–15.5)
WBC: 16.5 10*3/uL — ABNORMAL HIGH (ref 4.0–10.5)

## 2016-07-18 LAB — PROCALCITONIN: PROCALCITONIN: 0.27 ng/mL

## 2016-07-18 LAB — TROPONIN I
Troponin I: 0.4 ng/mL (ref <0.03)
Troponin I: 0.29 ng/mL (ref <0.03)

## 2016-07-18 LAB — PHOSPHORUS: Phosphorus: 2.6 mg/dL (ref 2.5–4.6)

## 2016-07-18 LAB — MAGNESIUM: Magnesium: 2.1 mg/dL (ref 1.7–2.4)

## 2016-07-18 MED ORDER — POTASSIUM CHLORIDE 20 MEQ/15ML (10%) PO SOLN
40.0000 meq | Freq: Three times a day (TID) | ORAL | Status: AC
  Administered 2016-07-18 (×2): 40 meq via ORAL
  Filled 2016-07-18 (×3): qty 30

## 2016-07-18 MED ORDER — PHENOL 1.4 % MT LIQD
2.0000 | OROMUCOSAL | Status: DC | PRN
  Filled 2016-07-18: qty 177

## 2016-07-18 MED ORDER — IPRATROPIUM-ALBUTEROL 0.5-2.5 (3) MG/3ML IN SOLN
3.0000 mL | Freq: Four times a day (QID) | RESPIRATORY_TRACT | Status: DC
  Administered 2016-07-18 – 2016-07-19 (×6): 3 mL via RESPIRATORY_TRACT
  Filled 2016-07-18 (×6): qty 3

## 2016-07-18 MED ORDER — DILTIAZEM HCL 30 MG PO TABS
30.0000 mg | ORAL_TABLET | Freq: Once | ORAL | Status: AC
  Administered 2016-07-18: 30 mg via ORAL
  Filled 2016-07-18: qty 1

## 2016-07-18 MED ORDER — ATROPINE SULFATE 1 MG/10ML IJ SOSY
PREFILLED_SYRINGE | INTRAMUSCULAR | Status: AC
  Filled 2016-07-18: qty 10

## 2016-07-18 MED ORDER — FUROSEMIDE 10 MG/ML IJ SOLN
40.0000 mg | Freq: Three times a day (TID) | INTRAMUSCULAR | Status: AC
  Administered 2016-07-18: 40 mg via INTRAVENOUS
  Filled 2016-07-18 (×2): qty 4

## 2016-07-18 MED ORDER — SODIUM CHLORIDE 0.9 % IV SOLN
30.0000 meq | Freq: Once | INTRAVENOUS | Status: DC
  Filled 2016-07-18: qty 15

## 2016-07-18 MED ORDER — POTASSIUM CHLORIDE 20 MEQ/15ML (10%) PO SOLN
40.0000 meq | Freq: Every day | ORAL | Status: DC
  Administered 2016-07-18: 40 meq via ORAL
  Filled 2016-07-18: qty 30

## 2016-07-18 MED ORDER — POTASSIUM CHLORIDE CRYS ER 20 MEQ PO TBCR
40.0000 meq | EXTENDED_RELEASE_TABLET | Freq: Three times a day (TID) | ORAL | Status: DC
  Filled 2016-07-18: qty 2

## 2016-07-18 MED ORDER — LEVOFLOXACIN 750 MG PO TABS
750.0000 mg | ORAL_TABLET | Freq: Every day | ORAL | Status: DC
  Administered 2016-07-19 – 2016-07-20 (×2): 750 mg via ORAL
  Filled 2016-07-18 (×2): qty 1

## 2016-07-18 NOTE — Consult Note (Signed)
Reason for Consult:   APF, MAT  Requesting Physician: Dr Jamison Neighbor Primary Cardiologist New  HPI:  Darrell Shah is a 66 y.o. 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 He developed increasing dyspnea over the past 2 days, not improved with inhalers and O2 (he has O2 in his truck). On 4/3 he became acutely SOB while driving. He pulled over and called EMS. He dyspnea worsened and he was intubated. On admission his EKG showed NSR with frequent PACs. The pt improved and was extubated this after noon. His EKG now shows occasional sinus beats with runs of what appears to be MAT. The pt is tolerating this well. His B/P is running low-systolic 92.    PMHx:  Past Medical History:  Diagnosis Date  . COPD (chronic obstructive pulmonary disease) (HCC)   . Sleep apnea     No past surgical history on file.  SOCHx:  reports that he has been smoking Cigarettes and Cigars.  He has a 165.00 pack-year smoking history. He does not have any smokeless tobacco history on file. He reports that he drinks alcohol. He reports that he does not use drugs.  FAMHx: F died of cancer, Mother of complications from a hernia  ALLERGIES: No Known Allergies  ROS: Review of Systems: General: negative for chills, fever, night sweats or weight changes.  Cardiovascular: negative for chest pain, edema, orthopnea, palpitations, paroxysmal nocturnal dyspnea HEENT: negative for any visual disturbances, blindness, glaucoma Dermatological: negative for rash Respiratory: negative for cough, hemoptysis, or wheezing Urologic: negative for hematuria or dysuria Abdominal: negative for nausea, vomiting, diarrhea, bright red blood per rectum, melena, or hematemesis Neurologic: negative for visual changes, syncope, or dizziness Musculoskeletal: negative for back  pain, joint pain, or swelling Psych: cooperative and appropriate All other systems reviewed and are otherwise negative except as noted above.   HOME MEDICATIONS: Prior to Admission medications   Medication Sig Start Date End Date Taking? Authorizing Provider  albuterol (PROVENTIL HFA;VENTOLIN HFA) 108 (90 Base) MCG/ACT inhaler Inhale 2 puffs into the lungs every 6 (six) hours as needed for wheezing or shortness of breath.   Yes Historical Provider, MD  budesonide-formoterol (SYMBICORT) 160-4.5 MCG/ACT inhaler Inhale 2 puffs into the lungs 2 (two) times daily.   Yes Historical Provider, MD  omeprazole (PRILOSEC) 40 MG capsule Take 40 mg by mouth daily.   Yes Historical Provider, MD  INCRUSE ELLIPTA 62.5 MCG/INH AEPB Inhale 1 puff into the lungs daily. 04/18/16   Historical Provider, MD  SPIRIVA HANDIHALER 18 MCG inhalation capsule Place 1 capsule into inhaler and inhale daily. 04/24/16   Historical Provider, MD    HOSPITAL MEDICATIONS: I have reviewed the patient's current medications.  VITALS: Blood pressure 101/65, pulse 90, temperature 97.4 F (36.3 C), temperature source Oral, resp. rate 10, height  (1.778 m), weight 188 lb 4.4 oz (85.4 kg), SpO2 100 %.  PHYSICAL EXAM: General appearance: alert, cooperative, no distress and extubated, on nasal O2-  Neck: no JVD Lungs: decreased breath sounds, expiratory wheezing Heart: irregularly irregular rhythm Abdomen: soft, non-tender; bowel sounds normal; no masses,  no organomegaly Extremities: extremities normal, atraumatic, no cyanosis or edema Pulses: 2+ and symmetric Skin: Skin color, texture, turgor normal. No rashes or lesions Neurologic: Grossly normal  LABS: Results for orders placed or performed  during the hospital encounter of 07/17/16 (from the past 24 hour(s))  Glucose, capillary     Status: Abnormal   Collection Time: 07/17/16  3:47 PM  Result Value Ref Range   Glucose-Capillary 170 (H) 65 - 99 mg/dL   Comment 1  Document in Chart   Troponin I (q 6hr x 3)     Status: Abnormal   Collection Time: 07/17/16  7:16 PM  Result Value Ref Range   Troponin I 0.63 (HH) <0.03 ng/mL  Glucose, capillary     Status: Abnormal   Collection Time: 07/17/16  7:54 PM  Result Value Ref Range   Glucose-Capillary 154 (H) 65 - 99 mg/dL   Comment 1 Notify RN    Comment 2 Document in Chart   Glucose, capillary     Status: Abnormal   Collection Time: 07/17/16 11:36 PM  Result Value Ref Range   Glucose-Capillary 177 (H) 65 - 99 mg/dL   Comment 1 Notify RN    Comment 2 Document in Chart   Troponin I     Status: Abnormal   Collection Time: 07/18/16 12:52 AM  Result Value Ref Range   Troponin I 0.40 (HH) <0.03 ng/mL  Glucose, capillary     Status: Abnormal   Collection Time: 07/18/16  3:29 AM  Result Value Ref Range   Glucose-Capillary 175 (H) 65 - 99 mg/dL   Comment 1 Notify RN    Comment 2 Document in Chart   Blood gas, arterial     Status: Abnormal   Collection Time: 07/18/16  3:40 AM  Result Value Ref Range   FIO2 40.00    Delivery systems VENTILATOR    Mode PRESSURE REGULATED VOLUME CONTROL    VT 540 mL   LHR 24 resp/min   Peep/cpap 5.0 cm H20   pH, Arterial 7.397 7.350 - 7.450   pCO2 arterial 35.8 32.0 - 48.0 mmHg   pO2, Arterial 78.6 (L) 83.0 - 108.0 mmHg   Bicarbonate 21.6 20.0 - 28.0 mmol/L   Acid-base deficit 2.5 (H) 0.0 - 2.0 mmol/L   O2 Saturation 95.1 %   Patient temperature 98.6    Collection site RIGHT RADIAL    Drawn by [phone]    Sample type ARTERIAL    Allens test (pass/fail) PASS PASS  CBC     Status: Abnormal   Collection Time: 07/18/16  5:05 AM  Result Value Ref Range   WBC 16.5 (H) 4.0 - 10.5 K/uL   RBC 3.87 (L) 4.22 - 5.81 MIL/uL   Hemoglobin 11.6 (L) 13.0 - 17.0 g/dL   HCT 60.4 (L) 54.0 - 98.1 %   MCV 94.1 78.0 - 100.0 fL   MCH 30.0 26.0 - 34.0 pg   MCHC 31.9 30.0 - 36.0 g/dL   RDW 19.1 (H) 47.8 - 29.5 %   Platelets 320 150 - 400 K/uL  Procalcitonin     Status: None    Collection Time: 07/18/16  5:05 AM  Result Value Ref Range   Procalcitonin 0.27 ng/mL  Basic metabolic panel     Status: Abnormal   Collection Time: 07/18/16  5:05 AM  Result Value Ref Range   Sodium 140 135 - 145 mmol/L   Potassium 3.7 3.5 - 5.1 mmol/L   Chloride 110 101 - 111 mmol/L   CO2 21 (L) 22 - 32 mmol/L   Glucose, Bld 211 (H) 65 - 99 mg/dL   BUN 26 (H) 6 - 20 mg/dL   Creatinine, Ser 6.21 0.61 - 1.24  mg/dL   Calcium 8.4 (L) 8.9 - 10.3 mg/dL   GFR calc non Af Amer >60 >60 mL/min   GFR calc Af Amer >60 >60 mL/min   Anion gap 9 5 - 15  Magnesium     Status: None   Collection Time: 07/18/16  5:05 AM  Result Value Ref Range   Magnesium 2.1 1.7 - 2.4 mg/dL  Phosphorus     Status: None   Collection Time: 07/18/16  5:05 AM  Result Value Ref Range   Phosphorus 2.6 2.5 - 4.6 mg/dL  Troponin I (q 6hr x 3)     Status: Abnormal   Collection Time: 07/18/16  5:09 AM  Result Value Ref Range   Troponin I 0.29 (HH) <0.03 ng/mL  Glucose, capillary     Status: Abnormal   Collection Time: 07/18/16  7:57 AM  Result Value Ref Range   Glucose-Capillary 182 (H) 65 - 99 mg/dL   Comment 1 Notify RN    Comment 2 Document in Chart   Glucose, capillary     Status: Abnormal   Collection Time: 07/18/16 11:17 AM  Result Value Ref Range   Glucose-Capillary 115 (H) 65 - 99 mg/dL   Comment 1 Notify RN    Comment 2 Document in Chart     EKG: NSR with frequent PACs and occasional PVC  IMAGING: Ct Head Wo Contrast  Result Date: 07/17/2016 CLINICAL DATA:  Altered mental status EXAM: CT HEAD WITHOUT CONTRAST TECHNIQUE: Contiguous axial images were obtained from the base of the skull through the vertex without intravenous contrast. COMPARISON:  None. FINDINGS: BRAIN: The ventricles and sulci are normal. No intraparenchymal hemorrhage, mass effect nor midline shift. No acute large vascular territory infarcts. Grey-white matter distinction is maintained. The basal ganglia are unremarkable. No abnormal  extra-axial fluid collections. Basal cisterns are not effaced and midline. The brainstem and cerebellar hemispheres are without acute abnormalities. VASCULAR: Unremarkable. SKULL/SOFT TISSUES: No skull fracture. No significant soft tissue swelling. ORBITS/SINUSES: The included ocular globes and orbital contents are normal.The mastoid air cells are clear. Left concha bullosa of the middle turbinate. Prominent left nasal septal spur with narrowing of the left nasal passage from levoconvex curvature of the nasal septum and spurring. Mucosal thickening of the ethmoid sinus. OTHER: Partially visualized endotracheal and orogastric tubes within the oropharynx. IMPRESSION: No acute intracranial abnormality. Electronically Signed   By: Tollie Eth M.D.   On: 07/17/2016 03:29   Ct Angio Chest Pe W Or Wo Contrast  Result Date: 07/17/2016 CLINICAL DATA:  Acute respiratory failure. EXAM: CT ANGIOGRAPHY CHEST WITH CONTRAST TECHNIQUE: Multidetector CT imaging of the chest was performed using the standard protocol during bolus administration of intravenous contrast. Multiplanar CT image reconstructions and MIPs were obtained to evaluate the vascular anatomy. CONTRAST:  100 mL of Isovue 370 intravenously. COMPARISON:  Radiographs of same day. FINDINGS: Cardiovascular: Satisfactory opacification of the pulmonary arteries to the segmental level. No evidence of pulmonary embolism. Normal heart size. No pericardial effusion. Mediastinum/Nodes: Endotracheal tube is in grossly good position. Distal tip of nasogastric tube is seen in stomach. No significant mediastinal mass or adenopathy is noted. Lungs/Pleura: No pneumothorax or pleural effusion is noted. Emphysematous disease is noted in the upper lobes bilaterally, with associated scarring. Mild bilateral posterior basilar subsegmental atelectasis is noted. Upper Abdomen: No acute abnormality. Musculoskeletal: No chest wall abnormality. No acute or significant osseous findings.  Review of the MIP images confirms the above findings. IMPRESSION: No definite evidence of pulmonary embolus. Endotracheal and  nasogastric tubes in grossly good position. Emphysematous disease is noted in the upper lobes bilaterally. Mild bilateral posterior basilar subsegmental atelectasis. Electronically Signed   By: Lupita Raider, M.D.   On: 07/17/2016 12:43   Dg Chest Port 1 View  Result Date: 07/18/2016 CLINICAL DATA:  Shortness of breath. EXAM: PORTABLE CHEST 1 VIEW COMPARISON:  CT 07/17/2016.  Chest x-ray 07/17/2016. FINDINGS: Endotracheal tube, NG tube in stable position. Heart size stable. Persistent bibasilar atelectatic changes. Mild bibasilar infiltrates cannot be excluded . Persistent biapical pleuroparenchymal thickening consistent scarring. COPD. No pleural effusion or pneumothorax. IMPRESSION: 1. Lines and tubes in stable position. 2. Persistent bibasilar subsegmental atelectasis. Mild basilar infiltrates cannot be excluded. 3. Persistent biapical pleural-parenchymal thickening consistent with scarring. COPD . Electronically Signed   By: Maisie Fus  Register   On: 07/18/2016 06:57   Dg Chest Portable 1 View  Result Date: 07/17/2016 CLINICAL DATA:  Post intubation, respiratory distress EXAM: PORTABLE CHEST 1 VIEW COMPARISON:  None. FINDINGS: Hyperinflated lungs. No pneumonic consolidation. Scarring in the upper lobes. Heart is normal in size. There is aortic atherosclerosis without aneurysm. Endotracheal tube tip is satisfactory at 4.6 cm above the carina. Gastric tube extends into the expected location of the gastric body. IMPRESSION: Emphysematous hyperinflation of the lungs. Scarring noted in the upper lobes. Satisfactory support line and tube positions. Electronically Signed   By: Tollie Eth M.D.   On: 07/17/2016 01:39    IMPRESSION:  MAT- Pt tolerating this well. His B/P is a little low but he may be able to tolerate low dose Diltiazem, hesitant to use beta blocker with active wheezing,  not a good candidate for Amio  COPD-with acute exacerbation and respiratory failure Pt with significant COPD by his history, on PRN O2 and inhalers prior to admission    RECOMMENDATION: Echo ordered. His B/P is now a little better- will try a dose of Diltiazem 30 mg and see how he tolerates that. If he does well with that we could try diltiazem 120 mg daily.   Time Spent Directly with Patient: [address] minutes  [place], [place]  [phone] beeper 07/18/2016, 2:18 PM   Personally seen and examined. Agree with above.  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  Multifocal atrial tachycardia  - This usually goes hand in hand with COPD, underlying lung condition and can be improved by treating the underlying lung condition however he is quite severe COPD by history.  - Adding diltiazem, calcium channel blocker, can sometimes be helpful to reduce the overall burden of atrial arrhythmias.  - Clearly he has an atriopathy with multiple atrial arrhythmias including isolated PACs, atrial bigeminy, multifocal atrial tachycardia. I do not think that the short bursts of tachycardia represent atrial fibrillation. Shah, no anticoagulation.  - I would try to avoid antiarrhythmic amiodarone given his underlying lung disease.  - Await echocardiogram.  Physical exam reveals a man with a long-standing smoking history, lungs with poor air movement bilaterally, end expiratory wheezes, regular rate and rhythm with occasional short bursts of regular tachycardia and ectopy, no significant edema.  Severe COPD  - Per primary team. He states that he is improving.  We will follow along.  Donato Schultz, MD

## 2016-07-18 NOTE — Progress Notes (Signed)
Canary Brim, NP informed pt last lactic acid 3.2, no repeat orders placed at this time.

## 2016-07-18 NOTE — Progress Notes (Signed)
Dr. Tasia Catchings at pt bedside, pt calm arouses and follows commands, orders for potassium given to nursing.

## 2016-07-18 NOTE — Procedures (Signed)
Extubation Procedure Note  Patient Details:   Name: Darrell Shah DOB: 1950/06/05 MRN: [id number]   Airway Documentation:  Airway 8 mm (Active)  Secured at (cm) 24 cm 07/18/2016  7:34 AM  Measured From Lips 07/18/2016  7:34 AM  Secured Location Left 07/18/2016  7:34 AM  Secured By [employer] 07/18/2016  7:34 AM  Tube Holder Repositioned Yes 07/18/2016  7:34 AM  Cuff Pressure (cm H2O) 24 cm H2O 07/18/2016  4:05 AM  Site Condition Dry 07/18/2016  7:34 AM    Evaluation  O2 sats: stable throughout Complications: No apparent complications Patient did tolerate procedure well. Bilateral Breath Sounds: Clear, Diminished   Yes  PT was extubated to a 3L [place]  PT was able to speak and clear his secretions  Sats are stable RT to monitor   Kearia Yin, Duane Lope 07/18/2016, 9:48 AM

## 2016-07-18 NOTE — Progress Notes (Addendum)
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 Quickly decomp with AMS and resp acidosis and was subsequently intubated. CTA/Dopplers neg for PE/DVT. Treating for COPD exacerbation  LINES / TUBES: L hand PIV R hand PIV L forearm PIV ETT 4/4>>>  CULTURES: Resp panel>>> NEG Resp culture>>>Not collected MRSA screen NEG>>>  ANTIBIOTICS: Ceftriaxone 4/4>>> Azithromycin 4/4>>>  OTHER: Phenylephrine ggt >> 4/4 Heparin ggt >> now SQ for DVT prophylaxis as of 4/4 Solumedrol 40 Q6H >> Precedex >> Duonebs Q4H >>  SIGNIFICANT EVENTS:  CTA neg for PE Dopplers neg for DVT  INTERVAL HISTORY: Episode of bradycardia to 40's but 90's at time MD eval. RN reports HR intermittently 130's for 1-2 seconds. Trop peaked 0.63 ~ 7pm, currently .29.   Off pressors currently.  CXR with good tube placement: ?BL basilar infiltrates  VITAL SIGNS: Temp:  [98 F (36.7 C)-99.1 F (37.3 C)] 98.1 F (36.7 C) (04/05 0327) Pulse Rate:  [48-97] 48 (04/05 0444) Resp:  [14-27] 24 (04/05 0444) BP: (77-118)/(44-74) 93/44 (04/05 0444) SpO2:  [100 %] 100 % (04/05 0444) FiO2 (%):  [40 %] 40 % (04/05 0405) Weight:  [188 lb 4.4 oz (85.4 kg)] 188 lb 4.4 oz (85.4 kg) (04/05 0200)  HEMODYNAMICS:    VENTILATOR SETTINGS: Vent Mode: PRVC FiO2 (%):  [40 %] 40 % Set Rate:  [24 bmp] 24 bmp Vt Set:  [540 mL] 540 mL PEEP:  [5 cmH20] 5 cmH20 Plateau Pressure:  [13 cmH20-19 cmH20] 13 cmH20  INTAKE / OUTPUT: Intake/Output      04/04 0701 - 04/05 0700   I.V. (mL/kg) 1875.7 (22)   IV Piggyback 1565   Total Intake(mL/kg) 3440.7 (40.3)   Urine (mL/kg/hr) 1100 (0.5)   Stool 0 (0)   Total Output 1100   Net +2340.7       Stool Occurrence 0 x     PHYSICAL EXAMINATION: General:  Critically-ill appearing caucasian male. +ETT, +NG Neuro:  RASS -1, -2 Cardiovascular:  RRR, no murmur appr Lungs:  Coarse and dim bs BL Abdomen: Protuberant but soft. +bs Musculoskeletal:  Trace edema BL LE, warm Skin:  Without cyanosis  LABS: Cbc  Recent Labs Lab 07/17/16 0043 07/17/16 0546 07/18/16 0505  WBC 16.2* 10.7* 16.5*  HGB 13.9 11.1* 11.6*  HCT 43.9 35.4* 36.4*  PLT 410* 307 320   Chemistry  Recent Labs Lab 07/17/16 0043 07/17/16 0546 07/18/16 0505  NA 140 135 140  K 4.4 3.1* 3.7  CL 104 103 110  CO2 24 19* 21*  BUN 28* 27* 26*  CREATININE 1.42* 1.41* 1.00  CALCIUM 8.8* 7.3* 8.4*  MG  --  2.2 2.1  PHOS  --  3.3 2.6  GLUCOSE 314* 413* 211*   Liver fxn  Recent Labs Lab 07/17/16 0043  AST 26  ALT 20  ALKPHOS 52  BILITOT 0.4  PROT 6.3*  ALBUMIN 3.8   coags No results for input(s): APTT, INR in the last 168 hours. Sepsis markers  Recent Labs Lab 07/17/16 0317 07/17/16 0546  LATICACIDVEN 2.2* 3.2*  PROCALCITON  --  0.23   Cardiac markers  Recent Labs Lab 07/17/16 1916 07/18/16 0052 07/18/16 0509  TROPONINI 0.63* 0.40* 0.29*   BNP No results  for input(s): PROBNP in the last 168 hours. ABG  Recent Labs Lab 07/17/16 0051 07/17/16 0325 07/18/16 0340  PHART 7.121* 7.266* 7.397  PCO2ART 88.5* 52.4* 35.8  PO2ART 336.0* 65.0* 78.6*  HCO3 29.0* 23.9 21.6  TCO2 32 25  --    CBG trend  Recent Labs Lab 07/17/16 1147 07/17/16 1547 07/17/16 1954 07/17/16 2336 07/18/16 0329  GLUCAP 174* 170* 154* 177* 175*   IMAGING: 4/4 CXR: emphysematous changes with scarring in BL upper lobes.  4/4 CTA: No definite evidence for PE. ?small RLL infiltrate vs effusion  DIAGNOSES: Active Problems:   Acute hypoxemic respiratory failure (HCC)   Acute renal failure (HCC)  ASSESSMENT / PLAN:  PULMONARY ASSESSMENT: Acute hypoxemic respiratory failure with hypercarbia 2/2 CAP vs COPD exacerbation. CTA  without evidence for PE and dopplers neg for DVT.  CTA did show superior emphysematous changes. No clear infiltrate.  Initial ABG pH 7.1, CO2 88 --> pH 7.26, pCO2 54, pO2 65 PH 7.39 and CO2 35 on ABG 4/5 Pct 0.23 Resp panel negative  PLAN:   ECHO pending IV Ceftriaxone + Azithromycin Solumedrol 40 Q6H Duonebs ~4H  CARDIOVASCULAR ASSESSMENT: Hypotensive still req pressors. Currently off Neo. Trop peaked at 0.63 ON, now 0.29.   PLAN:  Phenylephrine off currently Tele W/u as above  RENAL ASSESSMENT:  Cr 1.0, 1.4 on admission. Did have contrast load 4/4 but was given 1L bolus prior, continues on maintenance fluid  PLAN:  NS @ 37ml/hr  GASTROINTESTINAL ASSESSMENT:  NPO, NG. ?TF  PLAN:  Cont TF?  HEMATOLOGIC ASSESSMENT: Hb 13.9 > 11.1 > 11.6. No active signs of bleeding. Leukocytosis persists, 16.5 today. Diff with absolute lymphocytosis w/ atypical lymphocytes.  PLAN:  CBC in AM Transfuse per ICU protocol  INFECTIOUS ASSESSMENT:  Copd exac vs CAP CXR today with ?RLL  PLAN:   Ceftriaxone + Azith start 4/4  ENDOCRINE ASSESSMENT:   Hyperglycemic during admission however HbA1c 5.9%.   PLAN:  ISS  NEUROLOGIC ASSESSMENT:  AMS 2/2 hypercarbia.  UDS neg CT head neg  PLAN:   Precedex PRN fentanyl  CLINICAL SUMMARY:   The patient is critically ill with multiple organ systems failure and requires high complexity decision making for assessment and support, frequent evaluation and titration of therapies, application of advanced monitoring technologies and extensive interpretation of multiple databases.   Critical Care Time devoted to patient care services described in this note is  35  Minutes. This time reflects time of care of this signee Dr Koren Bound. This critical care time does not reflect procedure time, or teaching time or supervisory time of PA/NP/Med student/Med Resident etc but could involve care discussion time.

## 2016-07-18 NOTE — Progress Notes (Signed)
PT Cancellation Note  Patient Details Name: Darrell Shah MRN: [id number] DOB: 02-Sep-1950   Cancelled Treatment:    Reason Eval/Treat Not Completed: Other (comment) (Extubated this am.  Will return as able in the pm.  )Thanks.    [name] 07/18/2016, 11:42 AM [facility] [phone] (pager)

## 2016-07-18 NOTE — Progress Notes (Signed)
Post extubation, pt developed shortness breath and chest tightness, informed RT. Given nebu treatment and steroid IV. Now sounds better, but still wheezy.    Also, HR goes up from 90's to 170's SVT and comes back to 90's in few seconds happens more often than last night. Informed MD, came to assess the pt. Ordered EKG & cardiology consultation. Will keep close monitoring.   Danne Harbor

## 2016-07-18 NOTE — Progress Notes (Signed)
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 Quickly decomp with AMS and resp acidosis and was subsequently intubated. CTA/Dopplers neg for PE/DVT. Treating for COPD exacerbation  LINES / TUBES: L hand PIV R hand PIV L forearm PIV ETT 4/4>>>  CULTURES: Resp panel>>> NEG Resp culture>>>Not collected MRSA screen NEG>>>  ANTIBIOTICS: Ceftriaxone 4/4>>>4/5 Azithromycin 4/4>>>4/5 Levaquin 4/5>>>  OTHER: Phenylephrine ggt >> 4/4 Heparin ggt >> now SQ for DVT prophylaxis as of 4/4 Solumedrol 40 Q6H >> Precedex >> Duonebs Q4H >>  SIGNIFICANT EVENTS:  CTA neg for PE Dopplers neg for DVT  INTERVAL HISTORY:  Weaning well this AM, no events  VITAL SIGNS: Temp:  [97.6 F (36.4 C)-99.1 F (37.3 C)] 97.6 F (36.4 C) (04/05 0759) Pulse Rate:  [48-97] 90 (04/05 0734) Resp:  [10-27] 10 (04/05 0734) BP: (93-118)/(44-74) 101/65 (04/05 0730) SpO2:  [100 %] 100 % (04/05 0734) FiO2 (%):  [40 %] 40 % (04/05 0734) Weight:  [85.4 kg (188 lb 4.4 oz)] 85.4 kg (188 lb 4.4 oz) (04/05 0200)  HEMODYNAMICS:    VENTILATOR SETTINGS: Vent Mode: CPAP;PSV FiO2 (%):  [40 %] 40 % Set Rate:  [24 bmp] 24 bmp Vt Set:  [540 mL] 540 mL PEEP:  [5 cmH20] 5 cmH20 Pressure Support:  [5 cmH20] 5 cmH20 Plateau Pressure:  [13 cmH20-19 cmH20] 13 cmH20  INTAKE / OUTPUT: Intake/Output      04/04 0701 - 04/05 0700 04/05 0701 - 04/06 0700   I.V. (mL/kg) 2592.3 (30.4) 90.6 (1.1)   IV Piggyback 1565    Total Intake(mL/kg) 4157.3 (48.7) 90.6 (1.1)   Urine (mL/kg/hr) 1100 (0.5) 175 (0.8)   Emesis/NG output 800 (0.4)    Stool 0 (0)    Total Output 1900 175   Net +2257.3 -84.4        Stool Occurrence 0 x     PHYSICAL  EXAMINATION: General:  Critically-ill appearing caucasian male. +ETT, +NG Neuro:  RASS 0 to 1, moving all ext to command Cardiovascular:  RRR, no murmur appr Lungs:  Coarse and dim bs BL Abdomen: Protuberant but soft. +bs Musculoskeletal:  Trace edema BL LE, warm Skin:  Without cyanosis  LABS: Cbc  Recent Labs Lab 07/17/16 0043 07/17/16 0546 07/18/16 0505  WBC 16.2* 10.7* 16.5*  HGB 13.9 11.1* 11.6*  HCT 43.9 35.4* 36.4*  PLT 410* 307 320   Chemistry  Recent Labs Lab 07/17/16 0043 07/17/16 0546 07/18/16 0505  NA 140 135 140  K 4.4 3.1* 3.7  CL 104 103 110  CO2 24 19* 21*  BUN 28* 27* 26*  CREATININE 1.42* 1.41* 1.00  CALCIUM 8.8* 7.3* 8.4*  MG  --  2.2 2.1  PHOS  --  3.3 2.6  GLUCOSE 314* 413* 211*   Liver fxn  Recent Labs Lab 07/17/16 0043  AST 26  ALT 20  ALKPHOS 52  BILITOT 0.4  PROT 6.3*  ALBUMIN 3.8   coags No results for input(s): APTT, INR in the last 168 hours. Sepsis markers  Recent Labs Lab 07/17/16 0317 07/17/16 0546 07/18/16 0505  LATICACIDVEN 2.2* 3.2*  --   PROCALCITON  --  0.23 0.27   Cardiac markers  Recent Labs Lab 07/17/16 1916 07/18/16 0052  07/18/16 0509  TROPONINI 0.63* 0.40* 0.29*   BNP No results for input(s): PROBNP in the last 168 hours. ABG  Recent Labs Lab 07/17/16 0051 07/17/16 0325 07/18/16 0340  PHART 7.121* 7.266* 7.397  PCO2ART 88.5* 52.4* 35.8  PO2ART 336.0* 65.0* 78.6*  HCO3 29.0* 23.9 21.6  TCO2 32 25  --    CBG trend  Recent Labs Lab 07/17/16 1547 07/17/16 1954 07/17/16 2336 07/18/16 0329 07/18/16 0757  GLUCAP 170* 154* 177* 175* 182*   IMAGING: 4/4 CXR: emphysematous changes with scarring in BL upper lobes.  4/4 CTA: No definite evidence for PE. ?small RLL infiltrate vs effusion  DIAGNOSES: Active Problems:   Acute hypoxemic respiratory failure (HCC)   Acute renal failure (HCC)  ASSESSMENT / PLAN:  PULMONARY ASSESSMENT: Acute hypoxemic respiratory failure with hypercarbia  2/2 CAP vs COPD exacerbation. CTA without evidence for PE and dopplers neg for DVT.  CTA did show superior emphysematous changes. No clear infiltrate.  Initial ABG pH 7.1, CO2 88 --> pH 7.26, pCO2 54, pO2 65 PH 7.39 and CO2 35 on ABG 4/5 Pct 0.23 Resp panel negative  PLAN:   ECHO pending IV Ceftriaxone + Azithromycin Solumedrol 40 Q6H Duonebs ~4H Extubate IS per RT protocol PT evaluation OOB to chair  CARDIOVASCULAR ASSESSMENT: Hypotensive still req pressors. Currently off Neo. Trop peaked at 0.63 ON, now 0.29.   PLAN:  D/C pressors Tele W/u as above  RENAL ASSESSMENT:  Cr 1.0, 1.4 on admission. Did have contrast load 4/4 but was given 1L bolus prior, continues on maintenance fluid  PLAN:   KVO IVF Lasix 40 mg IV q8 x2 doses Replace electrolytes as indicated BMET in AM  GASTROINTESTINAL ASSESSMENT:  NPO, NG. ?TF  PLAN:   Diet if able to swallow  HEMATOLOGIC ASSESSMENT: Hb 13.9 > 11.1 > 11.6. No active signs of bleeding. Leukocytosis persists, 16.5 today. Diff with absolute lymphocytosis w/ atypical lymphocytes.  PLAN:  CBC in AM Transfuse per ICU protocol  INFECTIOUS ASSESSMENT:  Copd exac vs CAP CXR today with ?RLL   PLAN:   D/C Ceftriaxone + Azith start 4/4 Levaquin 4/5>>>4/10  ENDOCRINE ASSESSMENT:   Hyperglycemic during admission however HbA1c 5.9%.   PLAN:   ISS CBG  NEUROLOGIC ASSESSMENT:  AMS 2/2 hypercarbia.  UDS neg CT head neg  PLAN:   D/C Precedex PRN fentanyl  CLINICAL SUMMARY: Weaning well, extubate.  Smoking cessation.  The patient is critically ill with multiple organ systems failure and requires high complexity decision making for assessment and support, frequent evaluation and titration of therapies, application of advanced monitoring technologies and extensive interpretation of multiple databases.   Critical Care Time devoted to patient care services described in this note is  35  Minutes. This time reflects time of care of  this signee Dr Koren Bound. This critical care time does not reflect procedure time, or teaching time or supervisory time of PA/NP/Med student/Med Resident etc but could involve care discussion time.  Alyson Reedy, M.D. [facility] Pulmonary/Critical Care Medicine. Pager: [phone]. After hours pager: [phone].

## 2016-07-18 NOTE — Progress Notes (Signed)
Called to notify Dr Tasia Catchings pt now with episodes of irregular rhythm hr 140s ? Atrial fib, md coming to eval pt.

## 2016-07-18 NOTE — Progress Notes (Addendum)
Had brief episode of brady in 40's but now HR back to 90's. EKG reviewed, showing Atrial bigeminy with nonspecific TW inversions. Troponin mildly elevated, peaked at 0.63 and coming down. BP ok.   Called again by RN, patient is having episodes of tachy upto 120-130's for 1-2 secs, on monitor still looks like PACs with presence of P waves, does not appear to be Afib to me. Had low potassium on last BMET  - f/up BMET, replace K+. If HR is sustained in 130's may consider amio.

## 2016-07-18 NOTE — Progress Notes (Signed)
Pt with PACs and episodes of pauses with HR of 40s-50s, sbp 90s, pt easily aroused from sleep and denies cp. Called Resident, Ahmed and ekg ordered and done.

## 2016-07-19 ENCOUNTER — Inpatient Hospital Stay (HOSPITAL_COMMUNITY): Payer: Medicare HMO

## 2016-07-19 DIAGNOSIS — J441 Chronic obstructive pulmonary disease with (acute) exacerbation: Secondary | ICD-10-CM

## 2016-07-19 DIAGNOSIS — J9602 Acute respiratory failure with hypercapnia: Secondary | ICD-10-CM

## 2016-07-19 DIAGNOSIS — I471 Supraventricular tachycardia: Secondary | ICD-10-CM

## 2016-07-19 DIAGNOSIS — R0600 Dyspnea, unspecified: Secondary | ICD-10-CM

## 2016-07-19 DIAGNOSIS — I4719 Other supraventricular tachycardia: Secondary | ICD-10-CM

## 2016-07-19 LAB — ECHOCARDIOGRAM COMPLETE
Height: 70 in
Weight: 2934.76 oz

## 2016-07-19 LAB — GLUCOSE, CAPILLARY
GLUCOSE-CAPILLARY: 116 mg/dL — AB (ref 65–99)
GLUCOSE-CAPILLARY: 112 mg/dL — AB (ref 65–99)
Glucose-Capillary: 116 mg/dL — ABNORMAL HIGH (ref 65–99)

## 2016-07-19 LAB — PROCALCITONIN: Procalcitonin: 0.23 ng/mL

## 2016-07-19 LAB — BASIC METABOLIC PANEL
ANION GAP: 11 (ref 5–15)
BUN: 29 mg/dL — AB (ref 6–20)
CHLORIDE: 107 mmol/L (ref 101–111)
CO2: 26 mmol/L (ref 22–32)
Calcium: 9 mg/dL (ref 8.9–10.3)
Creatinine, Ser: 1.1 mg/dL (ref 0.61–1.24)
GFR calc Af Amer: >60 mL/min (ref >60)
GFR calc non Af Amer: >60 mL/min (ref >60)
Glucose, Bld: 110 mg/dL — ABNORMAL HIGH (ref 65–99)
POTASSIUM: 4.4 mmol/L (ref 3.5–5.1)
SODIUM: 144 mmol/L (ref 135–145)

## 2016-07-19 LAB — CBC
HEMATOCRIT: 39.9 % (ref 39.0–52.0)
HEMOGLOBIN: 12.7 g/dL — AB (ref 13.0–17.0)
MCH: 30.2 pg (ref 26.0–34.0)
MCHC: 31.8 g/dL (ref 30.0–36.0)
MCV: 95 fL (ref 78.0–100.0)
Platelets: 374 10*3/uL (ref 150–400)
RBC: 4.2 MIL/uL — AB (ref 4.22–5.81)
RDW: 18.7 % — ABNORMAL HIGH (ref 11.5–15.5)
WBC: 22.4 10*3/uL — ABNORMAL HIGH (ref 4.0–10.5)

## 2016-07-19 LAB — PHOSPHORUS: PHOSPHORUS: 2.6 mg/dL (ref 2.5–4.6)

## 2016-07-19 LAB — MAGNESIUM: MAGNESIUM: 2.6 mg/dL — AB (ref 1.7–2.4)

## 2016-07-19 MED ORDER — IPRATROPIUM-ALBUTEROL 0.5-2.5 (3) MG/3ML IN SOLN
3.0000 mL | Freq: Three times a day (TID) | RESPIRATORY_TRACT | Status: DC
  Administered 2016-07-20: 3 mL via RESPIRATORY_TRACT
  Filled 2016-07-19: qty 39
  Filled 2016-07-19: qty 3

## 2016-07-19 MED ORDER — METHYLPREDNISOLONE SODIUM SUCC 40 MG IJ SOLR
40.0000 mg | Freq: Two times a day (BID) | INTRAMUSCULAR | Status: DC
  Administered 2016-07-19 – 2016-07-20 (×2): 40 mg via INTRAVENOUS
  Filled 2016-07-19 (×3): qty 1

## 2016-07-19 MED ORDER — PERFLUTREN LIPID MICROSPHERE
1.0000 mL | INTRAVENOUS | Status: AC | PRN
  Administered 2016-07-19: 4 mL via INTRAVENOUS
  Filled 2016-07-19: qty 10

## 2016-07-19 MED ORDER — DILTIAZEM HCL ER COATED BEADS 120 MG PO CP24
120.0000 mg | ORAL_CAPSULE | Freq: Every day | ORAL | Status: DC
  Administered 2016-07-19 – 2016-07-20 (×2): 120 mg via ORAL
  Filled 2016-07-19 (×2): qty 1

## 2016-07-19 MED ORDER — PANTOPRAZOLE SODIUM 40 MG PO TBEC
40.0000 mg | DELAYED_RELEASE_TABLET | ORAL | Status: DC
  Administered 2016-07-19: 40 mg via ORAL
  Filled 2016-07-19: qty 1

## 2016-07-19 NOTE — Evaluation (Signed)
Physical Therapy Evaluation Patient Details Name: Darrell Shah MRN: [id number] DOB: 04-29-1950 Today's Date: 07/19/2016   History of Present Illness  66 yo admitted with respiratory failure, ETT 4/3-4/5. PMHx: COPD, smoker  Clinical Impression  Pt pleasant, moving well but limited by respiratory status. Pt required 2L O2 throughout with 4 standing rest breaks and cues for pursed lip breathing to maintain sats 89-94% with activity. 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 Pt with decreased activity tolerance and pulmonary function who will benefit from acute therapy to maximize mobility, activity and safety prior to D/C. Pt will need to be independent to return to current home environment.   HR 99    Follow Up Recommendations No PT follow up    Equipment Recommendations  None recommended by PT    Recommendations for Other Services       Precautions / Restrictions Precautions Precaution Comments: watch sats Restrictions Weight Bearing Restrictions: No      Mobility  Bed Mobility Overal bed mobility: Modified Independent                Transfers Overall transfer level: Modified independent                  Ambulation/Gait Ambulation/Gait assistance: Supervision Ambulation Distance (Feet): 150 Feet Assistive device: None Gait Pattern/deviations: WFL(Within Functional Limits);Decreased stride length   Gait velocity interpretation: Below normal speed for age/gender General Gait Details: pt with good balance and activity tolerance with 4 standing rests during gait to recover O2 sats on 2L dropping to 89% and recovers with standing. 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) Home Access: Stairs to enter   Secretary/administrator of Steps: 2 Home Layout: One level Home Equipment: None      Prior Function Level of Independence: Independent               Hand Dominance        Extremity/Trunk Assessment   Upper Extremity Assessment Upper Extremity Assessment: Overall WFL for tasks assessed    Lower Extremity Assessment Lower Extremity Assessment: Overall WFL for tasks assessed    Cervical / Trunk Assessment Cervical / Trunk Assessment: Normal  Communication   Communication: No difficulties  Cognition Arousal/Alertness: Awake/alert Behavior During Therapy: WFL for tasks assessed/performed Overall Cognitive Status: Within Functional Limits for tasks assessed                                        General Comments      Exercises     Assessment/Plan    PT Assessment Patient needs  continued PT services  PT Problem List Cardiopulmonary status limiting activity;Decreased activity tolerance;Decreased mobility       PT Treatment Interventions Gait training;Stair training;Functional mobility training;Patient/family education;Therapeutic activities    PT Goals (Current goals can be found in the Care Plan section)  Acute Rehab PT Goals Patient Stated Goal: return to my truck PT Goal Formulation: With patient Time For Goal Achievement: 07/26/16 Potential to Achieve Goals: Good    Frequency Min 3X/week   Barriers to discharge Decreased caregiver support;Inaccessible home environment      Co-evaluation               End of Session Equipment Utilized During  Treatment: Gait belt;Oxygen Activity Tolerance: Patient tolerated treatment well Patient left: in chair;with call bell/phone within reach Nurse Communication: Mobility status PT Visit Diagnosis: Difficulty in walking, not elsewhere classified (R26.2)    Time: 0981-1914 PT Time Calculation (min) (ACUTE ONLY): 26 min   Charges:   PT Evaluation $PT Eval Moderate Complexity: 1 Procedure     PT G Codes:        Delaney Meigs, PT [phone]   Navreet Bolda B Victorious Kundinger 07/19/2016, 10:20 AM

## 2016-07-19 NOTE — Care Management Note (Signed)
Case Management Note  Patient Details  Name: Darrell Shah MRN: [id number] Date of Birth: 07-21-1950  Subjective/Objective:    Pt admitted with SOB                Action/Plan:   PTA from home independent.  CM will continue to follow for discharge needs   Expected Discharge Date:                  Expected Discharge Plan:  Home/Self Care  In-House Referral:     Discharge planning Services  CM Consult  Post Acute Care Choice:    Choice offered to:     DME Arranged:    DME Agency:     HH Arranged:    HH Agency:     Status of Service:  In process, will continue to follow  If discussed at Long Length of Stay Meetings, dates discussed:    Additional Comments:  Cherylann Parr, RN 07/19/2016, 2:27 PM

## 2016-07-19 NOTE — Progress Notes (Signed)
Pt transferred to 3E29 via wheelchair.  Receiving RN and NT present upon arrival to new room.  Vitals and assessment stable, see flowsheets.

## 2016-07-19 NOTE — Progress Notes (Signed)
  Echocardiogram 2D Echocardiogram has been performed.  Darrell Shah 07/19/2016, 2:46 PM

## 2016-07-19 NOTE — Progress Notes (Signed)
Progress Note  Patient Name: Darrell Shah Date of Encounter: 07/19/2016  Primary Cardiologist: Dr Anne Fu (new)  Subjective   Pt breathing better- up with PT  Inpatient Medications    Scheduled Meds: . heparin subcutaneous  5,000 Units Subcutaneous Q8H  . insulin aspart  0-15 Units Subcutaneous Q4H  . ipratropium-albuterol  3 mL Nebulization Q6H  . levofloxacin  750 mg Oral Daily  . methylPREDNISolone (SOLU-MEDROL) injection  40 mg Intravenous Q6H  . pantoprazole (PROTONIX) IV  40 mg Intravenous QHS  . potassium chloride  40 mEq Oral TID   Continuous Infusions: . phenylephrine (NEO-SYNEPHRINE) Adult infusion Stopped (07/17/16 1622)   PRN Meds: sodium chloride, albuterol, fentaNYL (SUBLIMAZE) injection, phenol   Vital Signs    Vitals:   07/19/16 0600 07/19/16 0700 07/19/16 0729 07/19/16 0741  BP: 118/68 126/70    Pulse: 74 72 73   Resp: Temp:    98.4 F (36.9 C)  TempSrc:    Oral  SpO2: 96% 97% 98%   Weight:      Height:        Intake/Output Summary (Last 24 hours) at 07/19/16 0800 Last data filed at 07/19/16 0600  Gross per 24 hour  Intake              320 ml  Output             3300 ml  Net            -2980 ml   Filed Weights   07/17/16 0320 07/18/16 0200 07/19/16 0500  Weight: 182 lb 15.7 oz (83 kg) 188 lb 4.4 oz (85.4 kg) 183 lb 6.8 oz (83.2 kg)    Telemetry    NSR-PVCs - Personally Reviewed  ECG    Today's EKG pennding- Personally Reviewed  Physical Exam   GEN: No acute distress.   Neck: No JVD Cardiac: RRR, no murmurs, rubs, or gallops. Decreased heart sounds Respiratory:Bilateral expiratory wheezing GI: Soft, nontender, non-distended  MS: No edema; No deformity. Neuro:  Nonfocal  Psych: Normal affect   Labs    Chemistry Recent Labs Lab 07/17/16 0043 07/17/16 0546 07/18/16 0505 07/19/16 0243  NA 140 135 140 144  K 4.4 3.1* 3.7 4.4  CL 104 103 110 107  CO2 24 19* 21* 26  GLUCOSE 314* 413* 211* 110*  BUN 28* 27*  26* 29*  CREATININE 1.42* 1.41* 1.00 1.10  CALCIUM 8.8* 7.3* 8.4* 9.0  PROT 6.3*  --   --   --   ALBUMIN 3.8  --   --   --   AST 26  --   --   --   ALT 20  --   --   --   ALKPHOS 52  --   --   --   BILITOT 0.4  --   --   --   GFRNONAA 50* 50* >60 >60  GFRAA 58* 58* >60 >60  ANIONGAP Hematology Recent Labs Lab 07/17/16 0546 07/18/16 0505 07/19/16 0243  WBC 10.7* 16.5* 22.4*  RBC 3.66* 3.87* 4.20*  HGB 11.1* 11.6* 12.7*  HCT 35.4* 36.4* 39.9  MCV 96.7 94.1 95.0  MCH 29.8 30.0 30.2  MCHC 30.8 31.9 31.8  RDW 17.4* 18.1* 18.7*  PLT 307 320 374    Cardiac Enzymes Recent Labs Lab 07/17/16 1123 07/17/16 1916 07/18/16 0052 07/18/16 0509  TROPONINI 0.43* 0.63* 0.40* 0.29*    Recent Labs  Lab 07/17/16 0114  TROPIPOC 0.00     BNP Recent Labs Lab 07/17/16 0044  BNP 49.6     DDimer  Recent Labs Lab 07/17/16 0317  DDIMER 1.78*     Radiology    Ct Angio Chest Pe W Or Wo Contrast  Result Date: 07/17/2016 CLINICAL DATA:  Acute respiratory failure. EXAM: CT ANGIOGRAPHY CHEST WITH CONTRAST TECHNIQUE: Multidetector CT imaging of the chest was performed using the standard protocol during bolus administration of intravenous contrast. Multiplanar CT image reconstructions and MIPs were obtained to evaluate the vascular anatomy. CONTRAST:  100 mL of Isovue 370 intravenously. COMPARISON:  Radiographs of same day. FINDINGS: Cardiovascular: Satisfactory opacification of the pulmonary arteries to the segmental level. No evidence of pulmonary embolism. Normal heart size. No pericardial effusion. Mediastinum/Nodes: Endotracheal tube is in grossly good position. Distal tip of nasogastric tube is seen in stomach. No significant mediastinal mass or adenopathy is noted. Lungs/Pleura: No pneumothorax or pleural effusion is noted. Emphysematous disease is noted in the upper lobes bilaterally, with associated scarring. Mild bilateral posterior basilar subsegmental atelectasis is  noted. Upper Abdomen: No acute abnormality. Musculoskeletal: No chest wall abnormality. No acute or significant osseous findings. Review of the MIP images confirms the above findings. IMPRESSION: No definite evidence of pulmonary embolus. Endotracheal and nasogastric tubes in grossly good position. Emphysematous disease is noted in the upper lobes bilaterally. Mild bilateral posterior basilar subsegmental atelectasis. Electronically Signed   By: Lupita Raider, M.D.   On: 07/17/2016 12:43   Dg Chest Port 1 View  Result Date: 07/19/2016 CLINICAL DATA:  Respiratory distress EXAM: PORTABLE CHEST 1 VIEW COMPARISON:  July 18, 2016 FINDINGS: Endotracheal tube and nasogastric tube have been removed. No pneumothorax. There is patchy atelectasis in both lung bases. There is scarring in the right upper lobe near the apex. Lungs elsewhere are clear. Heart size and pulmonary vascularity are normal. There is atherosclerotic calcification in the aorta. No adenopathy. No bone lesions. IMPRESSION: Bibasilar atelectasis. Scarring right apex. No edema or consolidation. No pneumothorax. Electronically Signed   By: Bretta Bang III M.D.   On: 07/19/2016 07:26   Dg Chest Port 1 View  Result Date: 07/18/2016 CLINICAL DATA:  Shortness of breath. EXAM: PORTABLE CHEST 1 VIEW COMPARISON:  CT 07/17/2016.  Chest x-ray 07/17/2016. FINDINGS: Endotracheal tube, NG tube in stable position. Heart size stable. Persistent bibasilar atelectatic changes. Mild bibasilar infiltrates cannot be excluded . Persistent biapical pleuroparenchymal thickening consistent scarring. COPD. No pleural effusion or pneumothorax. IMPRESSION: 1. Lines and tubes in stable position. 2. Persistent bibasilar subsegmental atelectasis. Mild basilar infiltrates cannot be excluded. 3. Persistent biapical pleural-parenchymal thickening consistent with scarring. COPD . 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 Cardiology consulted for PAT/MAT.   Assessment & Plan    MAT- He is holding NSR. B/P stable today (low yesterday). Will add daily low dose Diltiazem, hesitant to use beta blocker with active wheezing.  COPD-with acute exacerbation and respiratory failure Pt required intubation shortly after admission. He was extubated yesterday and is up ambulating with PT today. He still has significant wheezing on exam.   Plan: Echo pending. Start Diltiazem CD 120 mg daily.  Signed, Corine Shelter, PA-C  07/19/2016, 8:00 AM   Feeling better (I'm going home tomorrow he  states).  Wheeze B, RRR, no edema Tele with improved, less MAT/PAT  Started dilt cd 120.   No further recs. Will sign off. Please call if ?  Donato Schultz, MD

## 2016-07-19 NOTE — Progress Notes (Addendum)
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 Quickly decomp with AMS and resp acidosis and was subsequently intubated. CTA/Dopplers neg for PE/DVT. Treating for COPD exacerbation. Extubated 4/5  LINES / TUBES: L hand PIV R hand PIV L forearm PIV ETT 4/4>>>4/5  CULTURES: Resp panel>>> NEG Resp culture>>>Not collected MRSA screen NEG>>>  ANTIBIOTICS: Ceftriaxone 4/4>4/5 Azithromycin 4/4>4/5 Levaquin 4/6 >>  OTHER: Phenylephrine ggt >> 4/4 Heparin ggt >> now SQ for DVT prophylaxis as of 4/4 Solumedrol  BID >> Duonebs Q4H >>  SIGNIFICANT EVENTS:  CTA neg for PE Dopplers neg for DVT Extubated 4/5  INTERVAL HISTORY: Extubated successfully 4/5 and now saturating well on RA. No pressors. Stable for transfer to floor.   VITAL SIGNS: Temp:  [97.4 F (36.3 C)-98.4 F (36.9 C)] 98.4 F (36.9 C) (04/06 0741) Pulse Rate:  [46-160] 74 (04/06 0900) Resp:  [12-26] 18 (04/06 0900) BP: (92-131)/(55-96) 120/71 (04/06 0900) SpO2:  [93 %-100 %] 96 % (04/06 0900) Weight:  [183 lb 6.8 oz (83.2 kg)] 183 lb 6.8 oz (83.2 kg) (04/06 0500)  INTAKE / OUTPUT: Intake/Output      04/05 0701 - 04/06 0700 04/06 0701 - 04/07 0700   P.O. 150    I.V. (mL/kg) 90.6 (1.1)    Other 130 10   IV Piggyback 50    Total Intake(mL/kg) 420.6 (5.1) 10 (0.1)   Urine (mL/kg/hr) 3575 (1.8)    Emesis/NG output     Stool     Total Output 3575     Net -3154.4 +10         PHYSICAL EXAMINATION: General:  Chronically-ill appearing caucasian male Neuro: Awake, alert, interactive. Responds to questions appropr.  Cardiovascular:  RRR, no murmur appr Lungs: Scattered wheezes but improved.  Abdomen: Protuberant but soft. +bs Skin:   Without cyanosis. Warm, dry  LABS: Cbc  Recent Labs Lab 07/17/16 0546 07/18/16 0505 07/19/16 0243  WBC 10.7* 16.5* 22.4*  HGB 11.1* 11.6* 12.7*  HCT 35.4* 36.4* 39.9  PLT 307 320 374   Chemistry  Recent Labs Lab 07/17/16 0546 07/18/16 0505 07/19/16 0243  NA 135 140 144  K 3.1* 3.7 4.4  CL 103 110 107  CO2 19* 21* 26  BUN 27* 26* 29*  CREATININE 1.41* 1.00 1.10  CALCIUM 7.3* 8.4* 9.0  MG 2.2 2.1 2.6*  PHOS 3.3 2.6 2.6  GLUCOSE 413* 211* 110*   Liver fxn  Recent Labs Lab 07/17/16 0043  AST 26  ALT 20  ALKPHOS 52  BILITOT 0.4  PROT 6.3*  ALBUMIN 3.8   coags No results for input(s): APTT, INR in the last 168 hours. Sepsis markers  Recent Labs Lab 07/17/16 0317 07/17/16 0546 07/18/16 0505 07/19/16 0243  LATICACIDVEN 2.2* 3.2*  --   --   PROCALCITON  --  0.23 0.27 0.23   Cardiac markers  Recent Labs Lab 07/17/16 1916 07/18/16 0052 07/18/16 0509  TROPONINI 0.63* 0.40* 0.29*   BNP No results for input(s): PROBNP in the last 168 hours. ABG  Recent Labs Lab 07/17/16 0051 07/17/16 0325 07/18/16 0340  PHART 7.121* 7.266* 7.397  PCO2ART 88.5* 52.4* 35.8  PO2ART 336.0* 65.0* 78.6*  HCO3 29.0* 23.9 21.6  TCO2 32 25  --  CBG trend  Recent Labs Lab 07/18/16 1537 07/18/16 1942 07/18/16 2326 07/19/16 0354 07/19/16 0738  GLUCAP 124* 107* 124* 116* 116*   IMAGING: 4/4 CXR: emphysematous changes with scarring in BL upper lobes.  4/4 CTA: No definite evidence for PE. ?small RLL infiltrate vs effusion  DIAGNOSES: Active Problems:   Acute hypoxemic respiratory failure (HCC)   Acute renal failure (HCC)   PAF (paroxysmal atrial fibrillation) (HCC)  ASSESSMENT / PLAN:  PULMONARY ASSESSMENT: Acute hypoxemic respiratory failure with hypercarbia 2/2 CAP vs COPD exacerbation. CTA without evidence for PE and dopplers neg for DVT. CTA did show superior emphysematous changes. No clear infiltrate.  Initial ABG pH 7.1, CO2 88 --> pH 7.26, pCO2  54, pO2 65 Pct 0.23 Resp panel negative  PLAN:  Abx narrowed to Levaquin Solumedrol 40 Qdaily Duonebs Q6H  CARDIOVASCULAR ASSESSMENT: Off pressors and maintaining good pressures. Cards saw pt, believe MAT and have started PO dilt. Rates controlled currently.  PLAN: Off pressors. Doing well on dilt. Now in NSR. Continue PO dilt 120  RENAL ASSESSMENT:  Cr 1.1, 1.4 on admission. Did have contrast load 4/4 but was given 1L bolus prior, continues on maintenance fluid  PLAN: d/c maintenance fluids  GASTROINTESTINAL ASSESSMENT:  Resumed diet, tolerating well  PLAN:  Cont as above.  HEMATOLOGIC ASSESSMENT: Hb 13.9 > 11.1 > 11.6 > 12.7. No active signs of bleeding. Leukocytosis persists, 22.4 today however continuing to receive steroids. Yesterdays diff with absolute lymphocytosis w/ atypical lymphocytes.  PLAN:  CBC in AM Transfuse per ICU protocol  INFECTIOUS ASSESSMENT:  Copd exac vs CAP CXR today with ?RLL  PLAN:   Ceftriaxone + Azith start 4/4 --> Levaquin start 4/6  ENDOCRINE ASSESSMENT:   Hyperglycemic during admission however HbA1c 5.9%.   PLAN:  ISS  NEUROLOGIC ASSESSMENT:  AMS 2/2 hypercarbia.  UDS neg CT head neg  PLAN:   PRN fentanyl  CLINICAL SUMMARY:   The patient is critically ill with multiple organ systems failure and requires high complexity decision making for assessment and support, frequent evaluation and titration of therapies, application of advanced monitoring technologies and extensive interpretation of multiple databases.   Critical Care Time devoted to patient care services described in this note is  35  Minutes. This time reflects time of care of this signee.This critical care time does not reflect procedure time, or teaching time or supervisory time of PA/NP/Med student/Med Resident etc but could involve care discussion time.   66 year old smoker with severe COPD admitted for a COPD exacerbation and intubated for hypercarbic respiratory  failure for 24 hours, extubated 4/5 and has done well Course complicated by multifocal atrial tachycardia On exam out of bed, decreased breath sounds bilateral, faint expiratory rhonchi, S1 and S2 tachycardia, no JVD or edema.  Labs showed leukocytosis, likely related to steroids, normal electrolytes, Lopid calcitonin Impression/plan  Acute respiratory failure-resolved COPD exacerbation, decrease Solu-Medrol to 40 every 12, continue bronchodilators and resume home Symbicort and Spiriva on discharge  Multifocal atrial tachycardia-controlled with Cardizem.  Transfer to floor and to triad service 4/7  Oretha Milch. MD

## 2016-07-20 ENCOUNTER — Encounter (HOSPITAL_COMMUNITY): Payer: Self-pay | Admitting: Nurse Practitioner

## 2016-07-20 DIAGNOSIS — I480 Paroxysmal atrial fibrillation: Secondary | ICD-10-CM

## 2016-07-20 LAB — CBC
HCT: 42.4 % (ref 39.0–52.0)
Hemoglobin: 14 g/dL (ref 13.0–17.0)
MCH: 30.4 pg (ref 26.0–34.0)
MCHC: 33 g/dL (ref 30.0–36.0)
MCV: 92.2 fL (ref 78.0–100.0)
PLATELETS: 289 10*3/uL (ref 150–400)
RBC: 4.6 MIL/uL (ref 4.22–5.81)
RDW: 18 % — ABNORMAL HIGH (ref 11.5–15.5)
WBC: 13.4 10*3/uL — AB (ref 4.0–10.5)

## 2016-07-20 MED ORDER — LEVOFLOXACIN 750 MG PO TABS: 750.0000 mg | ORAL_TABLET | Freq: Every day | ORAL | 0 refills | Status: AC

## 2016-07-20 MED ORDER — PREDNISONE 20 MG PO TABS: ORAL_TABLET | ORAL | 0 refills | Status: AC

## 2016-07-20 MED ORDER — DILTIAZEM HCL ER COATED BEADS 120 MG PO CP24: 120.0000 mg | ORAL_CAPSULE | Freq: Every day | ORAL | 0 refills | Status: AC

## 2016-07-20 NOTE — Progress Notes (Signed)
Patient given discharge instructions and all questions answered.  

## 2016-07-20 NOTE — Progress Notes (Signed)
Progress Note  Patient Name: Darrell Shah Date of Encounter: 07/20/2016  Primary Cardiologist: Dr Anne Fu (new)  Subjective   Pt breathing better, no palpitations.  Inpatient Medications    Scheduled Meds: . diltiazem  120 mg Oral Daily  . heparin subcutaneous  5,000 Units Subcutaneous Q8H  . ipratropium-albuterol  3 mL Nebulization TID  . levofloxacin  750 mg Oral Daily  . methylPREDNISolone (SOLU-MEDROL) injection  40 mg Intravenous Q12H  . pantoprazole  40 mg Oral Q24H   Continuous Infusions:  PRN Meds: sodium chloride, albuterol, fentaNYL (SUBLIMAZE) injection, phenol   Vital Signs    Vitals:   07/19/16 2001 07/19/16 2230 07/20/16 0635 07/20/16 0851  BP: 126/72  121/72   Pulse: 76  64   Resp: 18  18   Temp: 98.2 F (36.8 C)  98.2 F (36.8 C)   TempSrc: Oral  Oral   SpO2: 96% 95% 93% 97%  Weight:   176 lb 1.6 oz (79.9 kg)   Height:        Intake/Output Summary (Last 24 hours) at 07/20/16 1054 Last data filed at 07/20/16 0902  Gross per 24 hour  Intake              560 ml  Output             2325 ml  Net            -1765 ml   Filed Weights   07/19/16 0500 07/19/16 1704 07/20/16 0635  Weight: 183 lb 6.8 oz (83.2 kg) 179 lb 3.2 oz (81.3 kg) 176 lb 1.6 oz (79.9 kg)    Telemetry    NSR, I episode of MAT - 10 beats total - Personally Reviewed  ECG    None today  Physical Exam   GEN: No acute distress.   Neck: No JVD Cardiac: RRR, no murmurs, rubs, or gallops. Decreased heart sounds Respiratory:Bilateral expiratory wheezing GI: Soft, nontender, non-distended  MS: No edema; No deformity. Neuro:  Nonfocal  Psych: Normal affect   Labs    Chemistry  Recent Labs Lab 07/17/16 0043 07/17/16 0546 07/18/16 0505 07/19/16 0243  NA 140 135 140 144  K 4.4 3.1* 3.7 4.4  CL 104 103 110 107  CO2 24 19* 21* 26  GLUCOSE 314* 413* 211* 110*  BUN 28* 27* 26* 29*  CREATININE 1.42* 1.41* 1.00 1.10  CALCIUM 8.8* 7.3* 8.4* 9.0  PROT 6.3*  --   --   --    ALBUMIN 3.8  --   --   --   AST 26  --   --   --   ALT 20  --   --   --   ALKPHOS 52  --   --   --   BILITOT 0.4  --   --   --   GFRNONAA 50* 50* >60 >60  GFRAA 58* 58* >60 >60  ANIONGAP Hematology  Recent Labs Lab 07/18/16 0505 07/19/16 0243 07/20/16 0352  WBC 16.5* 22.4* 13.4*  RBC 3.87* 4.20* 4.60  HGB 11.6* 12.7* 14.0  HCT 36.4* 39.9 42.4  MCV 94.1 95.0 92.2  MCH 30.0 30.2 30.4  MCHC 31.9 31.8 33.0  RDW 18.1* 18.7* 18.0*  PLT 320 374 289    Cardiac Enzymes  Recent Labs Lab 07/17/16 1123 07/17/16 1916 07/18/16 0052 07/18/16 0509  TROPONINI 0.43* 0.63* 0.40* 0.29*     Recent Labs Lab 07/17/16 0114  TROPIPOC  0.00     BNP  Recent Labs Lab 07/17/16 0044  BNP 49.6     DDimer   Recent Labs Lab 07/17/16 0317  DDIMER 1.78*     Radiology    Dg Chest Port 1 View  Result Date: 07/19/2016 CLINICAL DATA:  Respiratory distress EXAM: PORTABLE CHEST 1 VIEW COMPARISON:  July 18, 2016 FINDINGS: Endotracheal tube and nasogastric tube have been removed. No pneumothorax. There is patchy atelectasis in both lung bases. There is scarring in the right upper lobe near the apex. Lungs elsewhere are clear. Heart size and pulmonary vascularity are normal. There is atherosclerotic calcification in the aorta. No adenopathy. No bone lesions. IMPRESSION: Bibasilar atelectasis. Scarring right apex. No edema or consolidation. No pneumothorax. Electronically Signed   By: Bretta Bang III M.D.   On: 07/19/2016 07:26    Cardiac Studies   Echo 07/19/2016 - Left ventricle: The cavity size was normal. Wall thickness was   increased in a pattern of mild LVH. Systolic function was normal.   The estimated ejection fraction was in the range of 60% to 65%.   Wall motion was normal; there were no regional wall motion   abnormalities. Left ventricular diastolic function parameters   were normal. - Atrial septum: No defect or patent foramen ovale was  identified.  Impressions: - Normal GLS -19.1.   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 Cardiology consulted for PAT/MAT.   Assessment & Plan    MAT- He is holding NSR with only 1 episode of MAT lasting 10 beats. Continue low dose diltiazem 120 mg po daily.  B/P stable today. Hesitant to use beta blocker with active wheezing.  COPD-with acute exacerbation and respiratory failure Pt required intubation shortly after admission. He was extubated on 4/5 and is up ambulating with PT today. He still has significant wheezing on exam.   Plan: Echo is completely normal. We will sign off.  Signed, Tobias Alexander, MD  07/20/2016, 10:54 AM

## 2016-07-25 NOTE — Discharge Summary (Signed)
Physician Discharge Summary  Darrell Shah ZOX:[id number] DOB: 1951/04/14 DOA: 07/17/2016  PCP: No PCP Per Patient  Admit date: 07/17/2016 Discharge date: 07/20/2016  Admitted From: Home.  Disposition: Home.   Recommendations for Outpatient Follow-up:  1. Follow up with PCP in 1-2 weeks 2. Please obtain BMP/CBC in one week     Discharge Condition:stable.  CODE STATUS:full code.  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 Quickly decomp with AMS and resp acidosis and was subsequently intubated. CTA/Dopplers neg for PE/DVT. Treating for COPD exacerbation. Extubated 4/5  Discharge Diagnoses:  Active Problems:   Acute hypoxemic respiratory failure (HCC)   Acute renal failure (HCC)   PAF (paroxysmal atrial fibrillation) (HCC)   Acute respiratory failure with hypercapnia (HCC)   COPD exacerbation (HCC)   Multifocal atrial tachycardia (HCC)   Acute hypoxemic respiratory failure:  2/2 CAP vs COPD exacerbation. CTA without evidence for PE and dopplers neg for DVT. CTA did show superior emphysematous changes. No clear infiltrate. Resp panel negative. Antibiotics narrowed to levaquin.   Multifocal atrial tachycardia:  On diltiazem.       Discharge Instructions  Discharge Instructions    Diet - low sodium heart healthy    Complete by:  As directed    Discharge instructions    Complete by:  As directed    Please follow up with PCP in one week.     Allergies as of 07/20/2016   No Known Allergies     Medication List    TAKE these medications   albuterol 108 (90 Base) MCG/ACT inhaler Commonly known as:  PROVENTIL HFA;VENTOLIN HFA Inhale 2 puffs into the lungs every 6 (six) hours as needed for wheezing or shortness of breath.   budesonide-formoterol 160-4.5 MCG/ACT inhaler Commonly known as:  SYMBICORT Inhale 2 puffs into the lungs 2 (two) times daily.    diltiazem 120 MG 24 hr capsule Commonly known as:  CARDIZEM CD Take 1 capsule (120 mg total) by mouth daily.   INCRUSE ELLIPTA 62.5 MCG/INH Aepb Generic drug:  umeclidinium bromide Inhale 1 puff into the lungs daily.   levofloxacin 750 MG tablet Commonly known as:  LEVAQUIN Take 1 tablet (750 mg total) by mouth daily.   omeprazole 40 MG capsule Commonly known as:  PRILOSEC Take 40 mg by mouth daily.   predniSONE 20 MG tablet Commonly known as:  DELTASONE Prednisone 60 mg daily for 2 days followed by  Prednisone 40 mg daily for 3 days followed by  Prednisone 20 mg daily for 3 days.   SPIRIVA HANDIHALER 18 MCG inhalation capsule Generic drug:  tiotropium Place 1 capsule into inhaler and inhale daily.       No Known Allergies   CULTURES: Resp panel>>> NEG Resp culture>>>Not collected MRSA screen NEG>>>  ANTIBIOTICS: Ceftriaxone 4/4>4/5 Azithromycin 4/4>4/5 Levaquin 4/6 >> complete the course for discharge.   SIGNIFICANT EVENTS:  CTA neg for PE Dopplers neg for DVT Extubated 4/5   Consultations:  cardiology   Procedures/Studies: Ct Head Wo Contrast  Result Date: 07/17/2016 CLINICAL DATA:  Altered mental status EXAM: CT HEAD WITHOUT CONTRAST TECHNIQUE: Contiguous axial images were obtained from the base of the skull through the vertex without intravenous contrast. COMPARISON:  None. FINDINGS: BRAIN: The ventricles and sulci are normal. No intraparenchymal hemorrhage, mass effect nor midline shift. No acute large vascular territory infarcts. Grey-white matter distinction is maintained. The basal ganglia are  unremarkable. No abnormal extra-axial fluid collections. Basal cisterns are not effaced and midline. The brainstem and cerebellar hemispheres are without acute abnormalities. VASCULAR: Unremarkable. SKULL/SOFT TISSUES: No skull fracture. No significant soft tissue swelling. ORBITS/SINUSES: The included ocular globes and orbital contents are normal.The mastoid  air cells are clear. Left concha bullosa of the middle turbinate. Prominent left nasal septal spur with narrowing of the left nasal passage from levoconvex curvature of the nasal septum and spurring. Mucosal thickening of the ethmoid sinus. OTHER: Partially visualized endotracheal and orogastric tubes within the oropharynx. IMPRESSION: No acute intracranial abnormality. Electronically Signed   By: Tollie Eth M.D.   On: 07/17/2016 03:29   Ct Angio Chest Pe W Or Wo Contrast  Result Date: 07/17/2016 CLINICAL DATA:  Acute respiratory failure. EXAM: CT ANGIOGRAPHY CHEST WITH CONTRAST TECHNIQUE: Multidetector CT imaging of the chest was performed using the standard protocol during bolus administration of intravenous contrast. Multiplanar CT image reconstructions and MIPs were obtained to evaluate the vascular anatomy. CONTRAST:  100 mL of Isovue 370 intravenously. COMPARISON:  Radiographs of same day. FINDINGS: Cardiovascular: Satisfactory opacification of the pulmonary arteries to the segmental level. No evidence of pulmonary embolism. Normal heart size. No pericardial effusion. Mediastinum/Nodes: Endotracheal tube is in grossly good position. Distal tip of nasogastric tube is seen in stomach. No significant mediastinal mass or adenopathy is noted. Lungs/Pleura: No pneumothorax or pleural effusion is noted. Emphysematous disease is noted in the upper lobes bilaterally, with associated scarring. Mild bilateral posterior basilar subsegmental atelectasis is noted. Upper Abdomen: No acute abnormality. Musculoskeletal: No chest wall abnormality. No acute or significant osseous findings. Review of the MIP images confirms the above findings. IMPRESSION: No definite evidence of pulmonary embolus. Endotracheal and nasogastric tubes in grossly good position. Emphysematous disease is noted in the upper lobes bilaterally. Mild bilateral posterior basilar subsegmental atelectasis. Electronically Signed   By: Lupita Raider, M.D.    On: 07/17/2016 12:43   Dg Chest Port 1 View  Result Date: 07/19/2016 CLINICAL DATA:  Respiratory distress EXAM: PORTABLE CHEST 1 VIEW COMPARISON:  July 18, 2016 FINDINGS: Endotracheal tube and nasogastric tube have been removed. No pneumothorax. There is patchy atelectasis in both lung bases. There is scarring in the right upper lobe near the apex. Lungs elsewhere are clear. Heart size and pulmonary vascularity are normal. There is atherosclerotic calcification in the aorta. No adenopathy. No bone lesions. IMPRESSION: Bibasilar atelectasis. Scarring right apex. No edema or consolidation. No pneumothorax. Electronically Signed   By: Bretta Bang III M.D.   On: 07/19/2016 07:26   Dg Chest Port 1 View  Result Date: 07/18/2016 CLINICAL DATA:  Shortness of breath. EXAM: PORTABLE CHEST 1 VIEW COMPARISON:  CT 07/17/2016.  Chest x-ray 07/17/2016. FINDINGS: Endotracheal tube, NG tube in stable position. Heart size stable. Persistent bibasilar atelectatic changes. Mild bibasilar infiltrates cannot be excluded . Persistent biapical pleuroparenchymal thickening consistent scarring. COPD. No pleural effusion or pneumothorax. IMPRESSION: 1. Lines and tubes in stable position. 2. Persistent bibasilar subsegmental atelectasis. Mild basilar infiltrates cannot be excluded. 3. Persistent biapical pleural-parenchymal thickening consistent with scarring. COPD . Electronically Signed   By: Maisie Fus  Register   On: 07/18/2016 06:57   Dg Chest Portable 1 View  Result Date: 07/17/2016 CLINICAL DATA:  Post intubation, respiratory distress EXAM: PORTABLE CHEST 1 VIEW COMPARISON:  None. FINDINGS: Hyperinflated lungs. No pneumonic consolidation. Scarring in the upper lobes. Heart is normal in size. There is aortic atherosclerosis without aneurysm. Endotracheal tube tip is satisfactory at 4.6 cm  above the carina. Gastric tube extends into the expected location of the gastric body. IMPRESSION: Emphysematous hyperinflation of the  lungs. Scarring noted in the upper lobes. Satisfactory support line and tube positions. Electronically Signed   By: Tollie Eth M.D.   On: 07/17/2016 01:39       Subjective: No new complaints.   Discharge Exam: Vitals:   07/20/16 0635 07/20/16 1247  BP: 121/72 136/84  Pulse: 64 91  Resp: 18 20  Temp: 98.2 F (36.8 C) 98.5 F (36.9 C)   Vitals:   07/19/16 2230 07/20/16 0635 07/20/16 0851 07/20/16 1247  BP:  121/72  136/84  Pulse:  64  91  Resp:  18  20  Temp:  98.2 F (36.8 C)  98.5 F (36.9 C)  TempSrc:  Oral  Oral  SpO2: 95% 93% 97% 93%  Weight:  79.9 kg (176 lb 1.6 oz)    Height:        General: Pt is alert, awake, not in acute distress Cardiovascular: RRR, S1/S2 +, no rubs, no gallops Respiratory: CTA bilaterally, no wheezing, no rhonchi Abdominal: Soft, NT, ND, bowel sounds + Extremities: no edema, no cyanosis    The results of significant diagnostics from this hospitalization (including imaging, microbiology, ancillary and laboratory) are listed below for reference.     Microbiology: Recent Results (from the past 240 hour(s))  MRSA PCR Screening     Status: None   Collection Time: 07/17/16  3:23 AM  Result Value Ref Range Status   MRSA by PCR NEGATIVE NEGATIVE Final    Comment:        The GeneXpert MRSA Assay (FDA approved for NASAL specimens only), is one component of a comprehensive MRSA colonization surveillance program. It is not intended to diagnose MRSA infection nor to guide or monitor treatment for MRSA infections.   Respiratory Panel by PCR     Status: None   Collection Time: 07/17/16  5:20 AM  Result Value Ref Range Status   Adenovirus NOT DETECTED NOT DETECTED Final   Coronavirus 229E NOT DETECTED NOT DETECTED Final   Coronavirus HKU1 NOT DETECTED NOT DETECTED Final   Coronavirus NL63 NOT DETECTED NOT DETECTED Final   Coronavirus OC43 NOT DETECTED NOT DETECTED Final   Metapneumovirus NOT DETECTED NOT DETECTED Final   Rhinovirus /  Enterovirus NOT DETECTED NOT DETECTED Final   Influenza A NOT DETECTED NOT DETECTED Final   Influenza B NOT DETECTED NOT DETECTED Final   Parainfluenza Virus 1 NOT DETECTED NOT DETECTED Final   Parainfluenza Virus 2 NOT DETECTED NOT DETECTED Final   Parainfluenza Virus 3 NOT DETECTED NOT DETECTED Final   Parainfluenza Virus 4 NOT DETECTED NOT DETECTED Final   Respiratory Syncytial Virus NOT DETECTED NOT DETECTED Final   Bordetella pertussis NOT DETECTED NOT DETECTED Final   Chlamydophila pneumoniae NOT DETECTED NOT DETECTED Final   Mycoplasma pneumoniae NOT DETECTED NOT DETECTED Final     Labs: BNP (last 3 results)  Recent Labs  07/17/16 0044  BNP 49.6   Basic Metabolic Panel:  Recent Labs Lab 07/19/16 0243  NA 144  K 4.4  CL 107  CO2 26  GLUCOSE 110*  BUN 29*  CREATININE 1.10  CALCIUM 9.0  MG 2.6*  PHOS 2.6   Liver Function Tests: No results for input(s): AST, ALT, ALKPHOS, BILITOT, PROT, ALBUMIN in the last 168 hours. No results for input(s): LIPASE, AMYLASE in the last 168 hours. No results for input(s): AMMONIA in the last 168 hours. CBC:  Recent Labs Lab 07/19/16 0243 07/20/16 0352  WBC 22.4* 13.4*  HGB 12.7* 14.0  HCT 39.9 42.4  MCV 95.0 92.2  PLT 374 289   Cardiac Enzymes: No results for input(s): CKTOTAL, CKMB, CKMBINDEX, TROPONINI in the last 168 hours. BNP: Invalid input(s): POCBNP CBG:  Recent Labs Lab 07/18/16 1942 07/18/16 2326 07/19/16 0354 07/19/16 0738 07/19/16 1158  GLUCAP 107* 124* 116* 116* 112*   D-Dimer No results for input(s): DDIMER in the last 72 hours. Hgb A1c No results for input(s): HGBA1C in the last 72 hours. Lipid Profile No results for input(s): CHOL, HDL, LDLCALC, TRIG, CHOLHDL, LDLDIRECT in the last 72 hours. Thyroid function studies No results for input(s): TSH, T4TOTAL, T3FREE, THYROIDAB in the last 72 hours.  Invalid input(s): FREET3 Anemia work up No results for input(s): VITAMINB12, FOLATE, FERRITIN,  TIBC, IRON, RETICCTPCT in the last 72 hours. Urinalysis No results found for: COLORURINE, APPEARANCEUR, LABSPEC, PHURINE, GLUCOSEU, HGBUR, BILIRUBINUR, KETONESUR, PROTEINUR, UROBILINOGEN, NITRITE, LEUKOCYTESUR Sepsis Labs Invalid input(s): PROCALCITONIN,  WBC,  LACTICIDVEN Microbiology Recent Results (from the past 240 hour(s))  MRSA PCR Screening     Status: None   Collection Time: 07/17/16  3:23 AM  Result Value Ref Range Status   MRSA by PCR NEGATIVE NEGATIVE Final    Comment:        The GeneXpert MRSA Assay (FDA approved for NASAL specimens only), is one component of a comprehensive MRSA colonization surveillance program. It is not intended to diagnose MRSA infection nor to guide or monitor treatment for MRSA infections.   Respiratory Panel by PCR     Status: None   Collection Time: 07/17/16  5:20 AM  Result Value Ref Range Status   Adenovirus NOT DETECTED NOT DETECTED Final   Coronavirus 229E NOT DETECTED NOT DETECTED Final   Coronavirus HKU1 NOT DETECTED NOT DETECTED Final   Coronavirus NL63 NOT DETECTED NOT DETECTED Final   Coronavirus OC43 NOT DETECTED NOT DETECTED Final   Metapneumovirus NOT DETECTED NOT DETECTED Final   Rhinovirus / Enterovirus NOT DETECTED NOT DETECTED Final   Influenza A NOT DETECTED NOT DETECTED Final   Influenza B NOT DETECTED NOT DETECTED Final   Parainfluenza Virus 1 NOT DETECTED NOT DETECTED Final   Parainfluenza Virus 2 NOT DETECTED NOT DETECTED Final   Parainfluenza Virus 3 NOT DETECTED NOT DETECTED Final   Parainfluenza Virus 4 NOT DETECTED NOT DETECTED Final   Respiratory Syncytial Virus NOT DETECTED NOT DETECTED Final   Bordetella pertussis NOT DETECTED NOT DETECTED Final   Chlamydophila pneumoniae NOT DETECTED NOT DETECTED Final   Mycoplasma pneumoniae NOT DETECTED NOT DETECTED Final     Time coordinating discharge: Over 30 minutes  SIGNED:   Kathlen Mody, MD  [facility] 07/25/2016, 12:38 PM Pager   If 7PM-7AM,  please contact night-coverage [email or web address] Password [email or web address]

## 2018-05-14 IMAGING — CR DG CHEST 1V PORT
2 series · 2 of 2 positions shown · non-contrast
Comparison: None.

CLINICAL DATA: Post intubation, respiratory distress

EXAM:
PORTABLE CHEST 1 VIEW

[AP (1 of 2)]
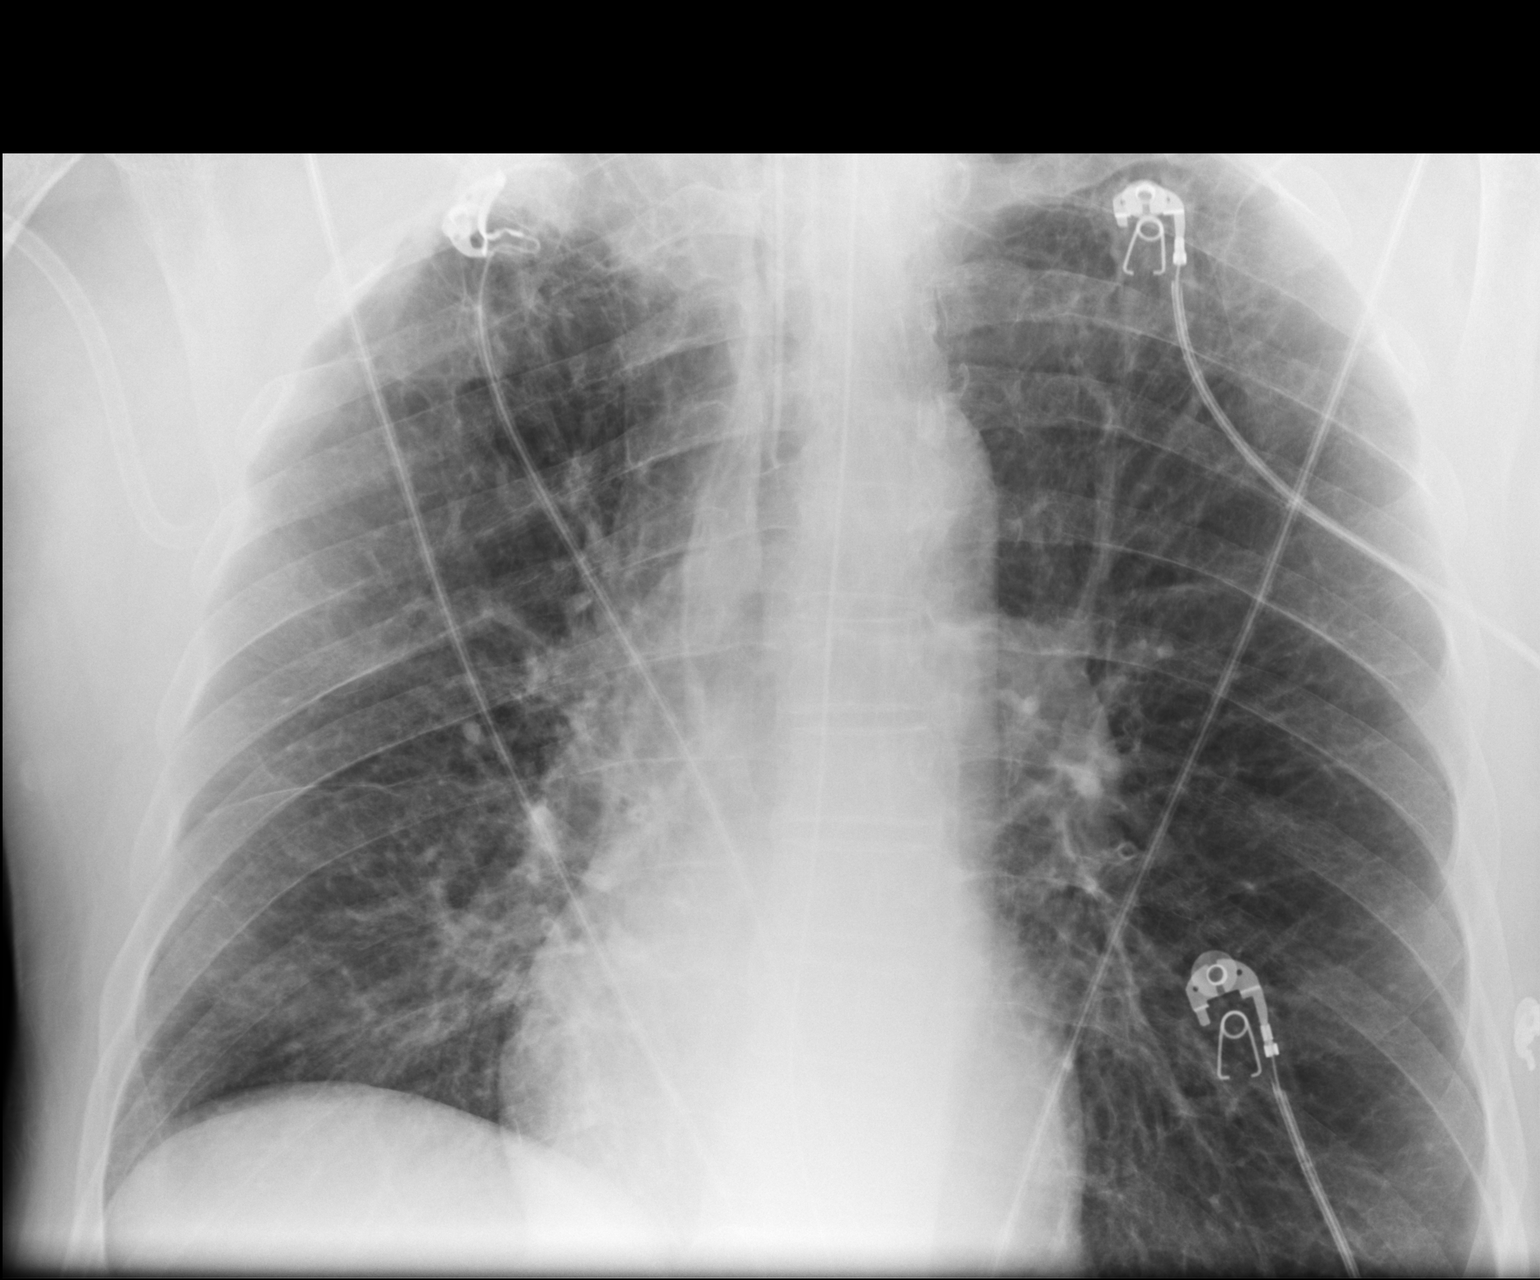

[AP (2 of 2)]
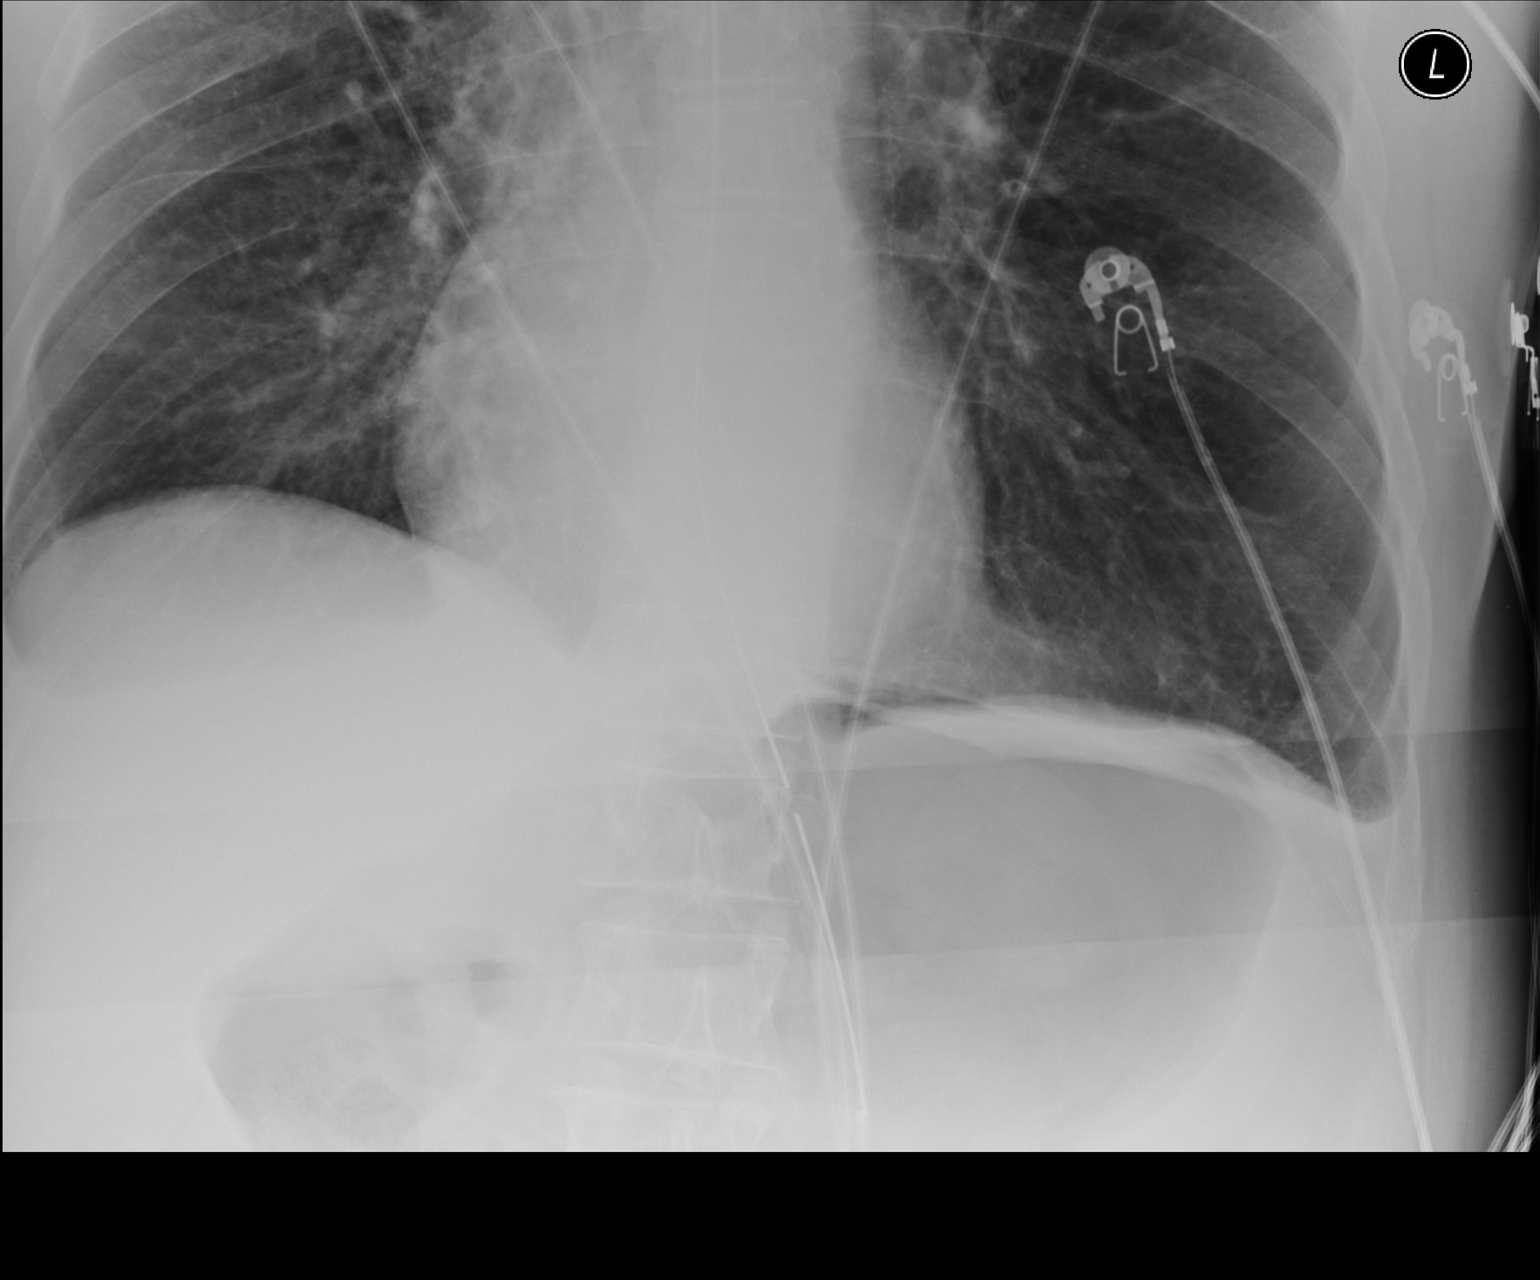

[2 of 2 positions shown; findings below may reference images not displayed]

FINDINGS: Hyperinflated lungs. No pneumonic consolidation. Scarring in the
upper lobes. Heart is normal in size. There is aortic
atherosclerosis without aneurysm. Endotracheal tube tip is
satisfactory at 4.6 cm above the carina. Gastric tube extends into
the expected location of the gastric body.
IMPRESSION: Emphysematous hyperinflation of the lungs. Scarring noted in the
upper lobes. Satisfactory support line and tube positions.

## 2018-05-14 IMAGING — CT CT ANGIO CHEST
2 of 6 series · 18 of 36 positions shown · IV contrast (Omni 300)
Comparison: Radiographs of same day.

CLINICAL DATA: Acute respiratory failure.

EXAM:
CT ANGIOGRAPHY CHEST WITH CONTRAST
TECHNIQUE: Multidetector CT imaging of the chest was performed using the
standard protocol during bolus administration of intravenous
contrast. Multiplanar CT image reconstructions and MIPs were
obtained to evaluate the vascular anatomy.
CONTRAST:  100 mL of Isovue 370 intravenously.

[Series 8: pe thins · axial · 0.76mm/px · z∈[+1253,+1541]mm · 17 of 325 slices shown]
[im 19/325  lung]
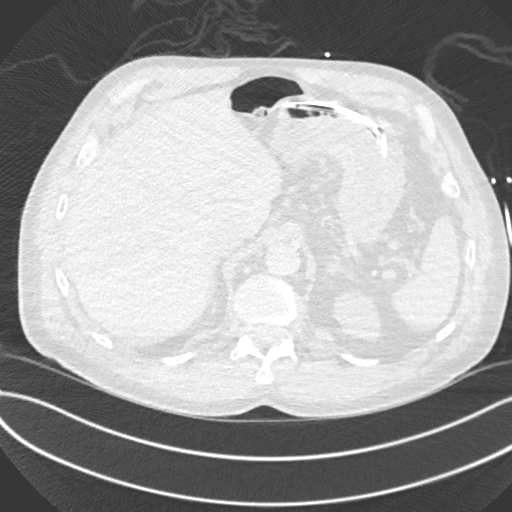
[im 37/325  mediastinal]
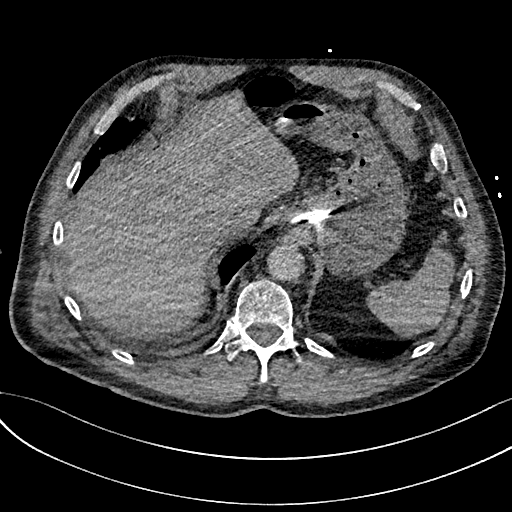
[im 55/325  lung]
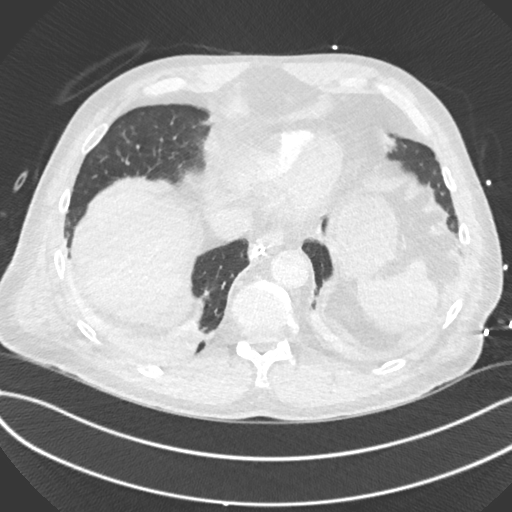
[im 73/325  mediastinal]
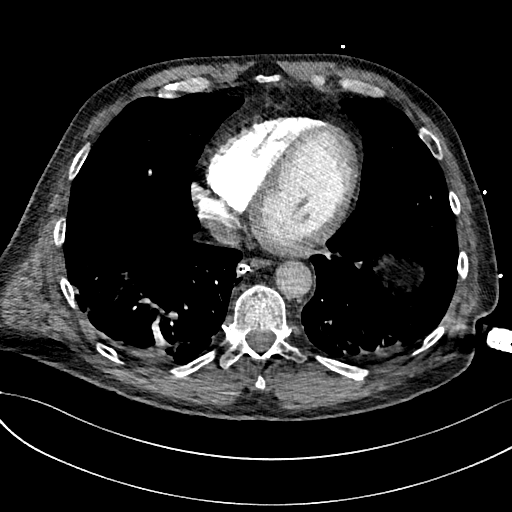
[im 91/325  lung]
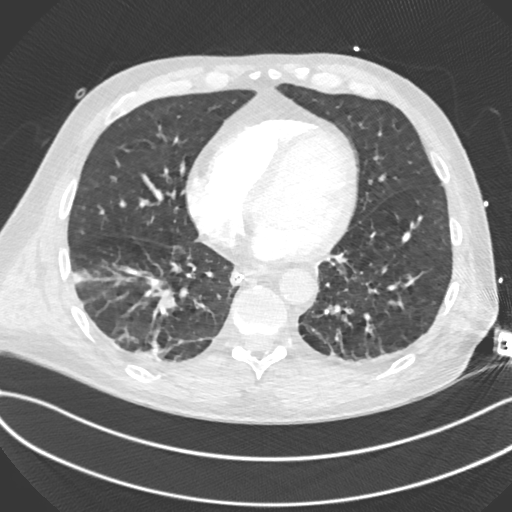
[im 109/325  mediastinal]
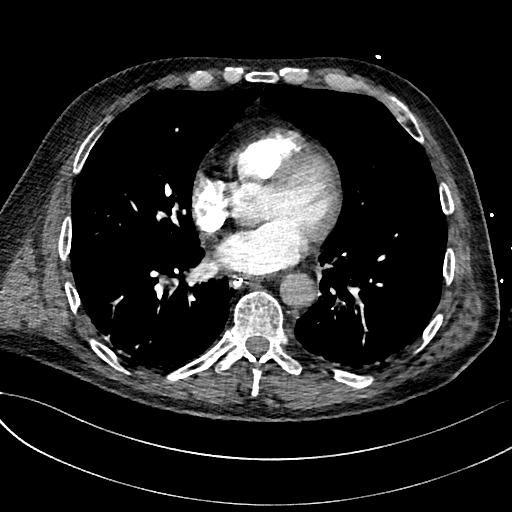
[im 127/325  lung]
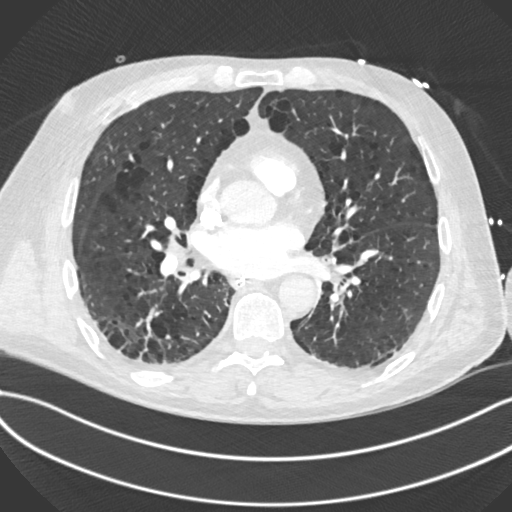
[im 145/325  mediastinal]
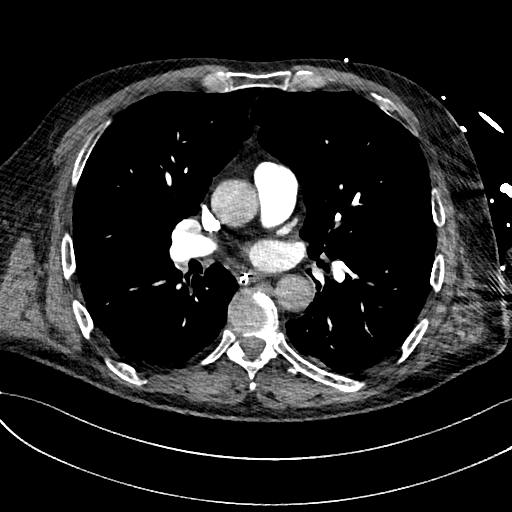
[im 163/325  lung]
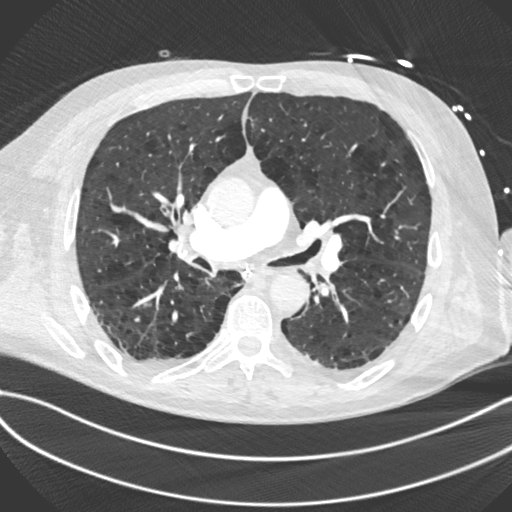
[im 181/325  mediastinal]
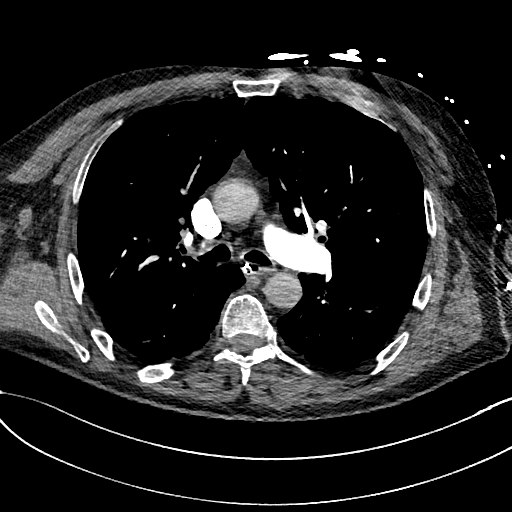
[im 199/325  lung]
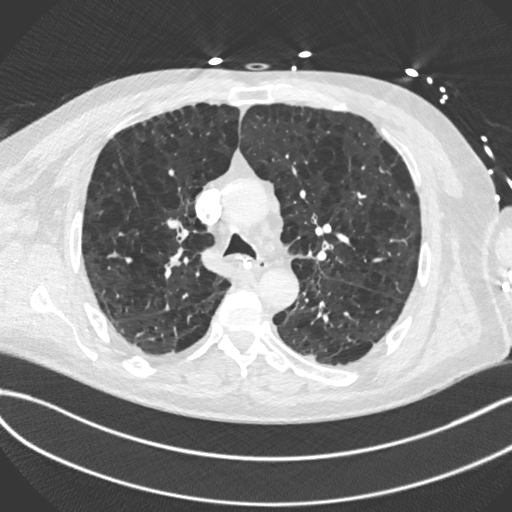
[im 217/325  mediastinal]
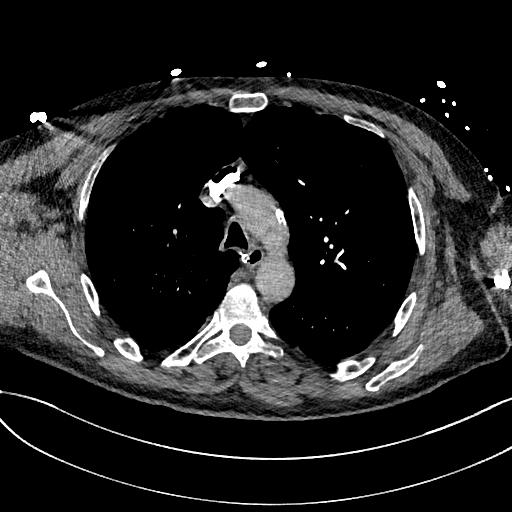
[im 235/325  lung]
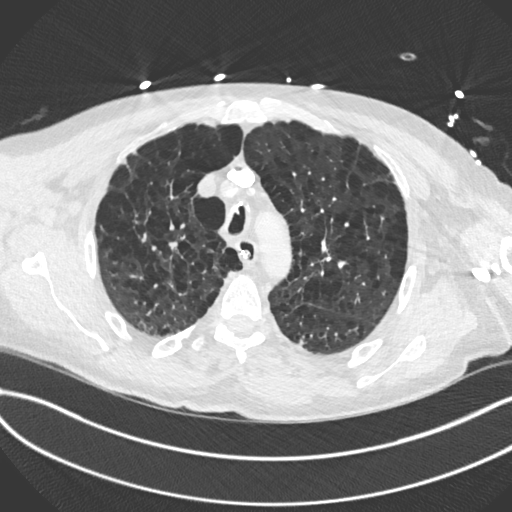
[im 253/325  mediastinal]
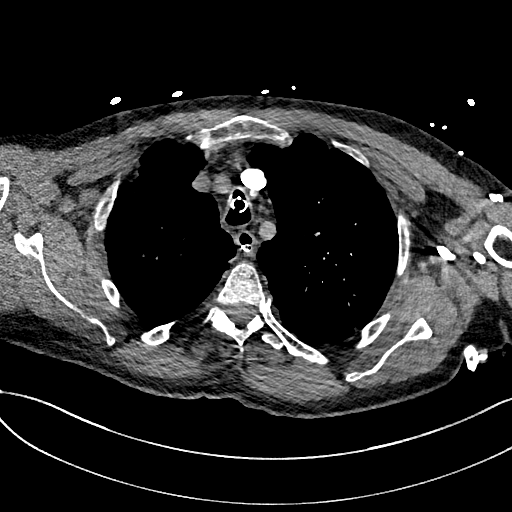
[im 271/325  lung]
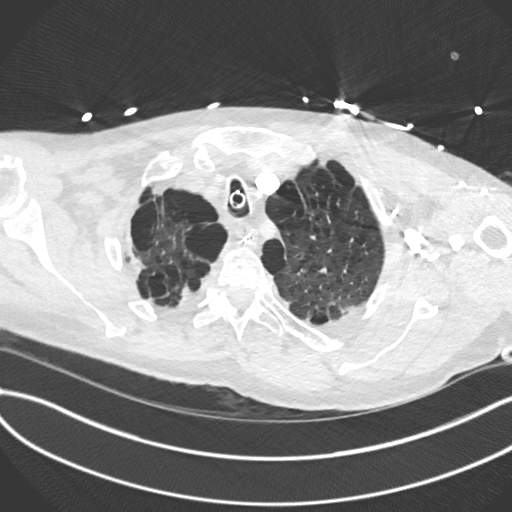
[im 289/325  mediastinal]
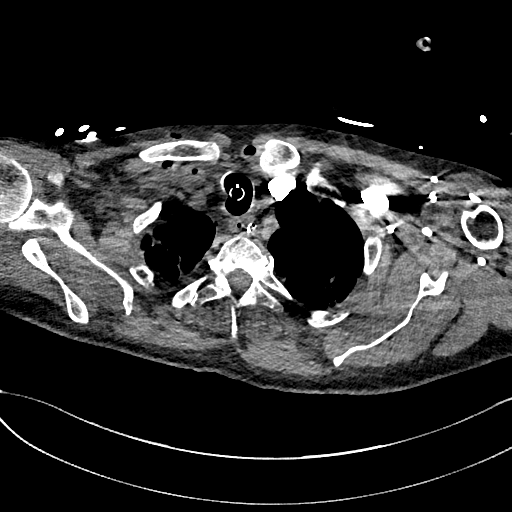
[im 307/325  lung]
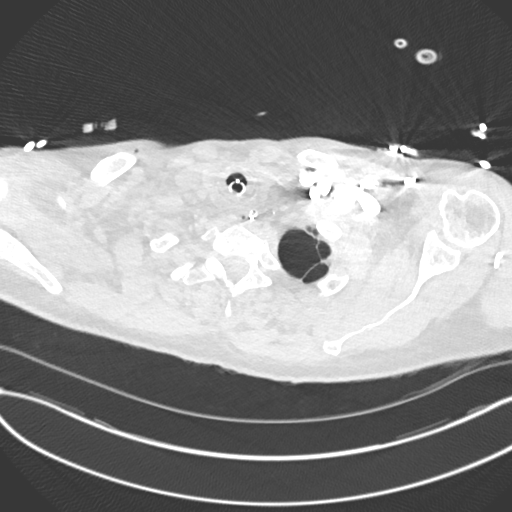

[Series 9: pe 2mm cor · coronal · 0.63mm/px · 1 of 151 slices shown]
[im 76/151  mediastinal]
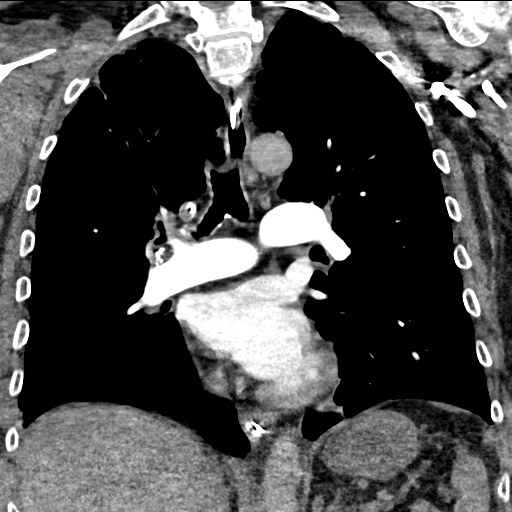

[18 of 36 positions shown; findings below may reference images not displayed]

FINDINGS: Cardiovascular: Satisfactory opacification of the pulmonary arteries
to the segmental level. No evidence of pulmonary embolism. Normal
heart size. No pericardial effusion.

Mediastinum/Nodes: Endotracheal tube is in grossly good position.
Distal tip of nasogastric tube is seen in stomach. No significant
mediastinal mass or adenopathy is noted.

Lungs/Pleura: No pneumothorax or pleural effusion is noted.
Emphysematous disease is noted in the upper lobes bilaterally, with
associated scarring. Mild bilateral posterior basilar subsegmental
atelectasis is noted.

Upper Abdomen: No acute abnormality.

Musculoskeletal: No chest wall abnormality. No acute or significant
osseous findings.

Review of the MIP images confirms the above findings.
IMPRESSION: No definite evidence of pulmonary embolus. Endotracheal and
nasogastric tubes in grossly good position. Emphysematous disease is
noted in the upper lobes bilaterally. Mild bilateral posterior
basilar subsegmental atelectasis.
